# Patient Record
Sex: Male | Born: 1955 | Hispanic: Refuse to answer | Smoking: Current every day smoker
Health system: Southern US, Community
[De-identification: ages and names within clinical notes are randomized; demographics above are authoritative.]

## PROBLEM LIST (undated history)

## (undated) DIAGNOSIS — M5030 Other cervical disc degeneration, unspecified cervical region: Secondary | ICD-10-CM

## (undated) DIAGNOSIS — K828 Other specified diseases of gallbladder: Secondary | ICD-10-CM

## (undated) DIAGNOSIS — I10 Essential (primary) hypertension: Secondary | ICD-10-CM

## (undated) DIAGNOSIS — Z87442 Personal history of urinary calculi: Secondary | ICD-10-CM

## (undated) DIAGNOSIS — G5600 Carpal tunnel syndrome, unspecified upper limb: Secondary | ICD-10-CM

## (undated) DIAGNOSIS — E119 Type 2 diabetes mellitus without complications: Secondary | ICD-10-CM

## (undated) DIAGNOSIS — E7800 Pure hypercholesterolemia, unspecified: Secondary | ICD-10-CM

## (undated) HISTORY — PX: OTHER SURGICAL HISTORY: SHX169

## (undated) HISTORY — PX: APPENDECTOMY: SHX54

---

## 2008-01-20 ENCOUNTER — Emergency Department: Payer: Self-pay | Admitting: Emergency Medicine

## 2008-01-21 ENCOUNTER — Emergency Department (HOSPITAL_COMMUNITY): Admission: EM | Admit: 2008-01-21 | Discharge: 2008-01-21 | Payer: Self-pay | Admitting: Emergency Medicine

## 2008-10-13 ENCOUNTER — Ambulatory Visit: Payer: Self-pay | Admitting: Gastroenterology

## 2009-07-24 ENCOUNTER — Ambulatory Visit: Payer: Self-pay | Admitting: Internal Medicine

## 2009-09-22 ENCOUNTER — Encounter: Admission: RE | Admit: 2009-09-22 | Discharge: 2009-09-22 | Payer: Self-pay | Admitting: *Deleted

## 2009-10-06 ENCOUNTER — Encounter: Admission: RE | Admit: 2009-10-06 | Discharge: 2009-10-06 | Payer: Self-pay | Admitting: *Deleted

## 2009-12-17 ENCOUNTER — Encounter: Admission: RE | Admit: 2009-12-17 | Discharge: 2009-12-17 | Payer: Self-pay | Admitting: Neurosurgery

## 2010-03-31 ENCOUNTER — Emergency Department: Payer: Self-pay | Admitting: Emergency Medicine

## 2010-06-06 ENCOUNTER — Encounter: Payer: Self-pay | Admitting: Orthopedic Surgery

## 2011-02-16 LAB — POCT I-STAT, CHEM 8
BUN: 14
Calcium, Ion: 1.17
Chloride: 106
Creatinine, Ser: 0.8
Glucose, Bld: 101 — ABNORMAL HIGH
HCT: 42
Hemoglobin: 14.3
Potassium: 3.9
Sodium: 139
TCO2: 26

## 2011-02-16 LAB — CBC
HCT: 40.5
Hemoglobin: 13.8
MCHC: 34
MCV: 91
RBC: 4.45
RDW: 12.5

## 2011-02-16 LAB — POCT CARDIAC MARKERS
CKMB, poc: 1.6
Myoglobin, poc: 31.5
Troponin i, poc: <0.05

## 2011-05-08 IMAGING — CT CT CHEST W/ CM
1 series · 15 of 31 positions shown, 19 images · non-contrast
Comparison: none

REASON FOR EXAM: possible pulmonary nodule  abn chest xray
COMMENTS:

[Series 2: soft tissue · axial · 0.78mm/px · z∈[+359,+674]mm · 15 of 69 slices shown, 19 images]
[im 3/69  mediastinal]
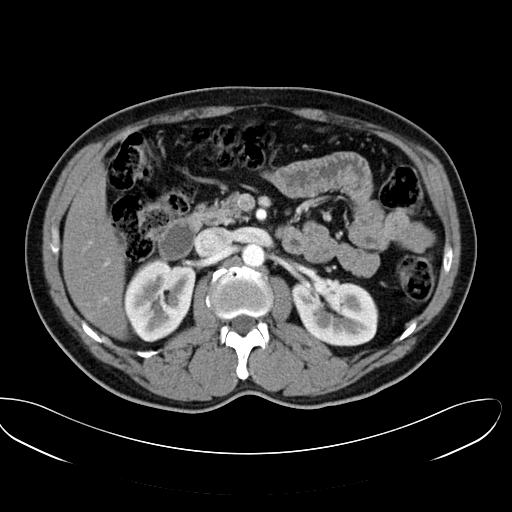
[im 3/69  lung]
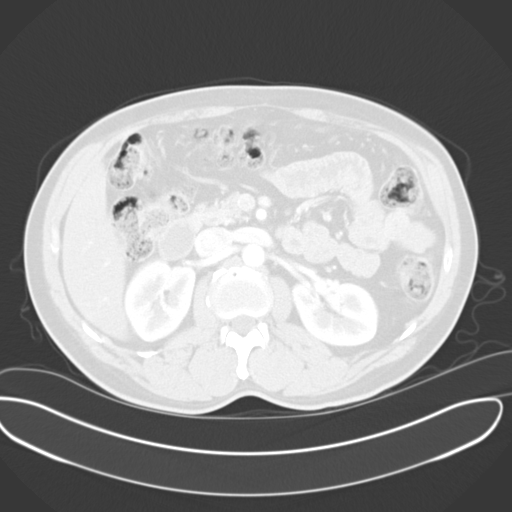
[im 8/69  lung]
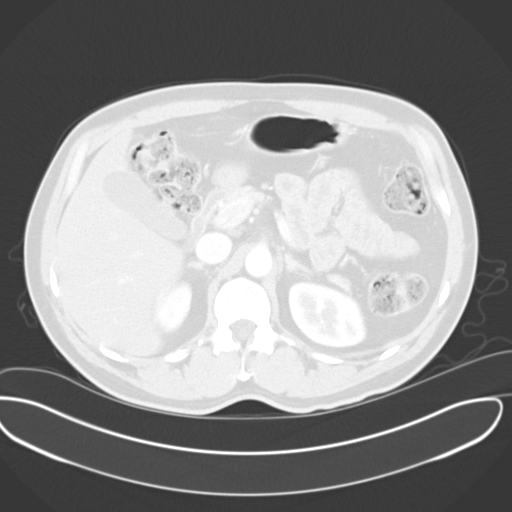
[im 13/69  lung]
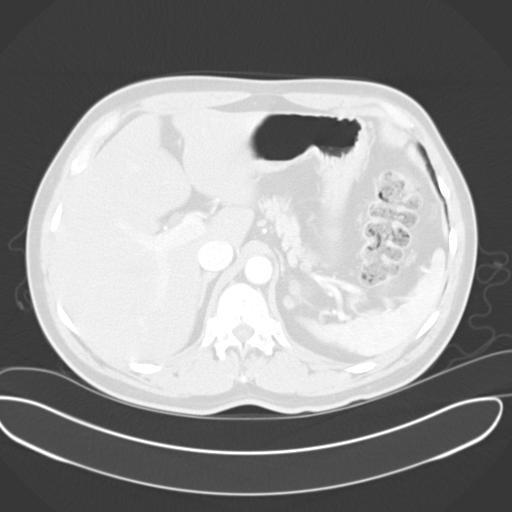
[im 16/69  lung]
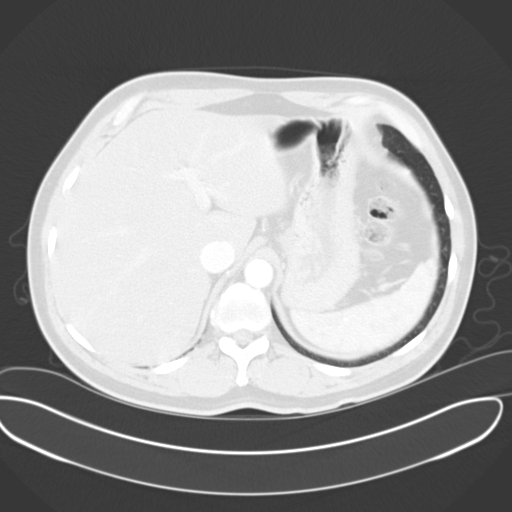
[im 21/69  mediastinal]
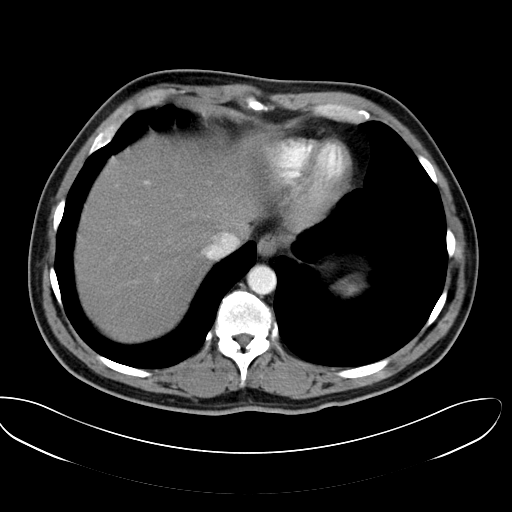
[im 21/69  lung]
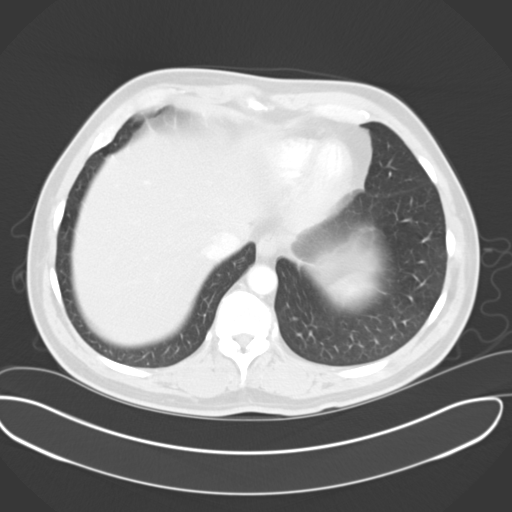
[im 26/69  lung]
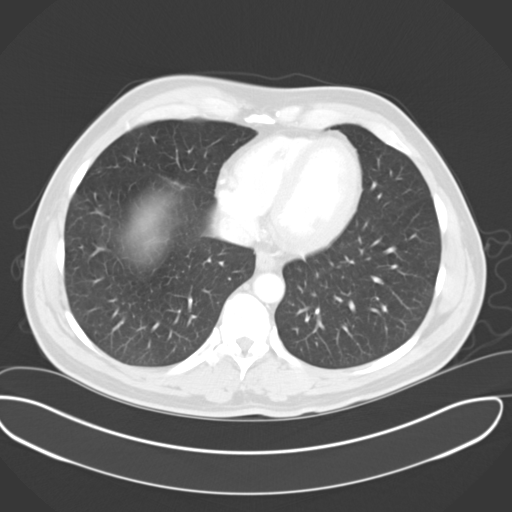
[im 31/69  lung]
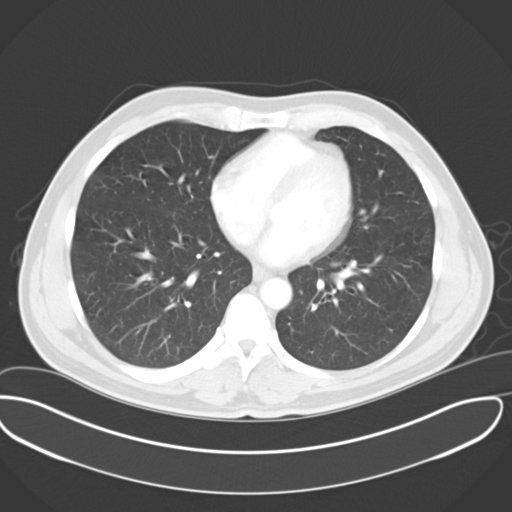
[im 36/69  lung]
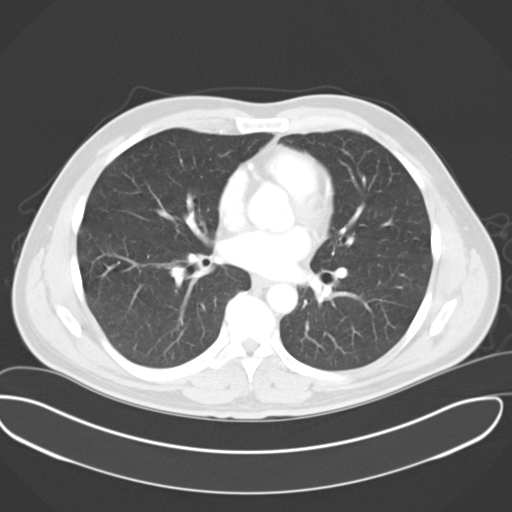
[im 38/69  mediastinal]
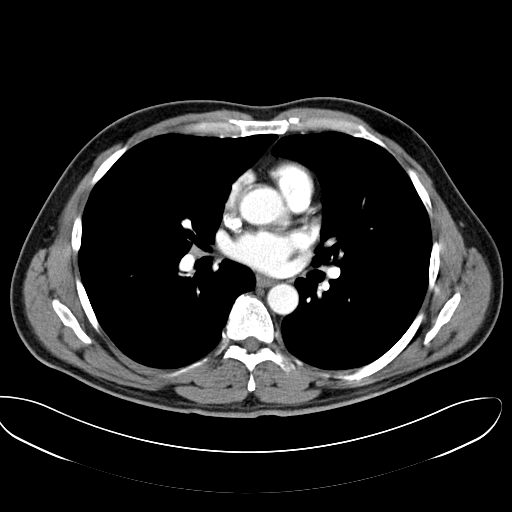
[im 38/69  lung]
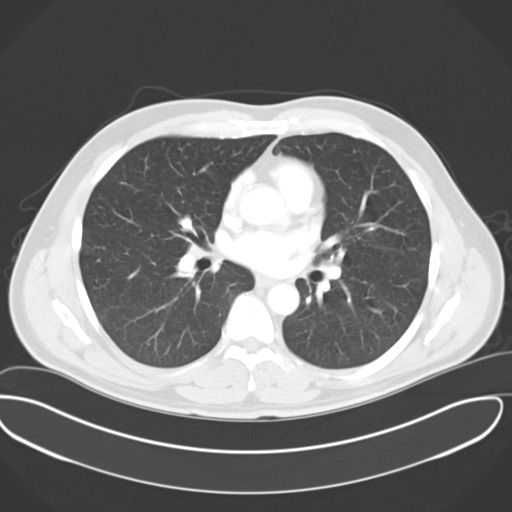
[im 43/69  lung]
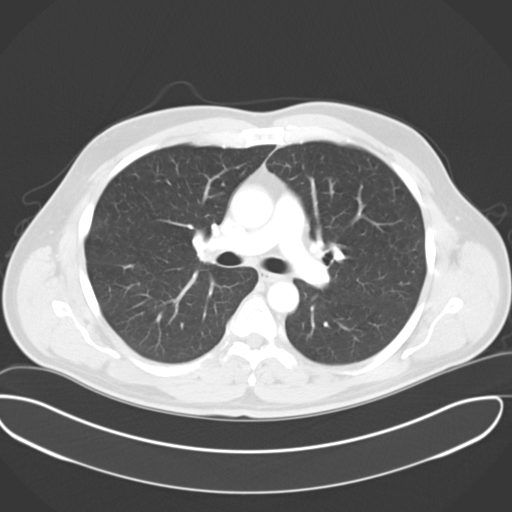
[im 48/69  lung]
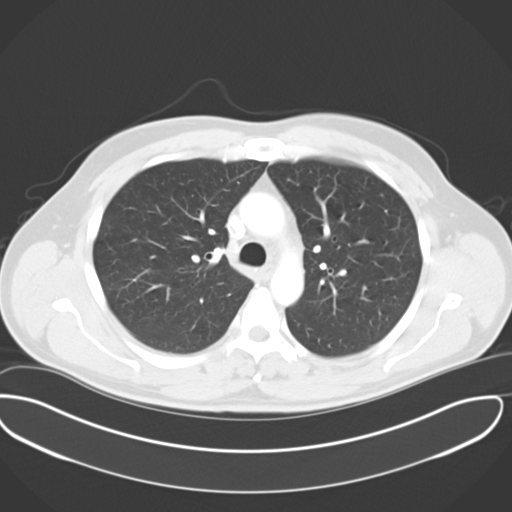
[im 53/69  lung]
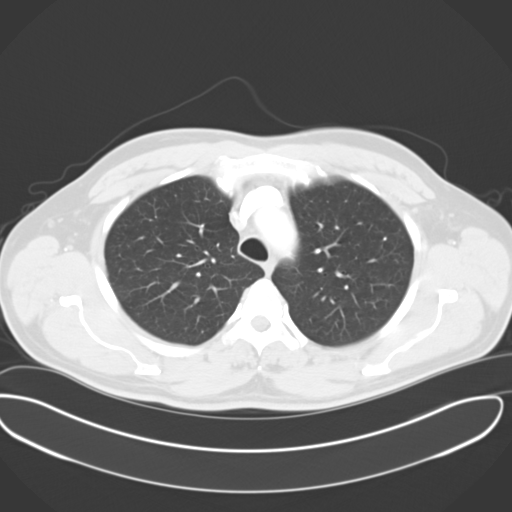
[im 56/69  mediastinal]
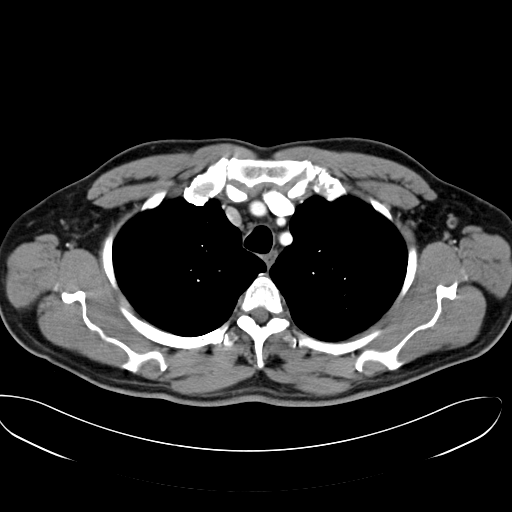
[im 56/69  lung]
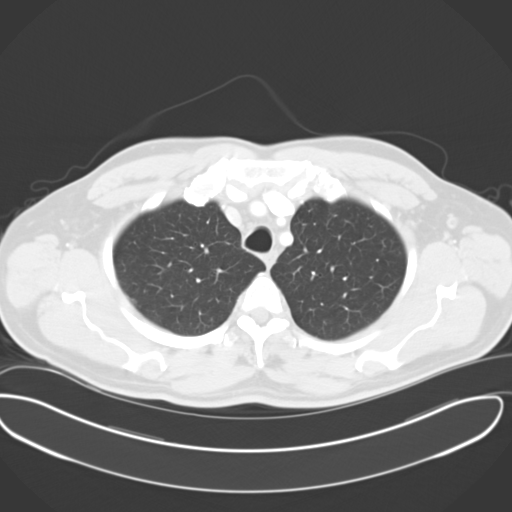
[im 61/69  lung]
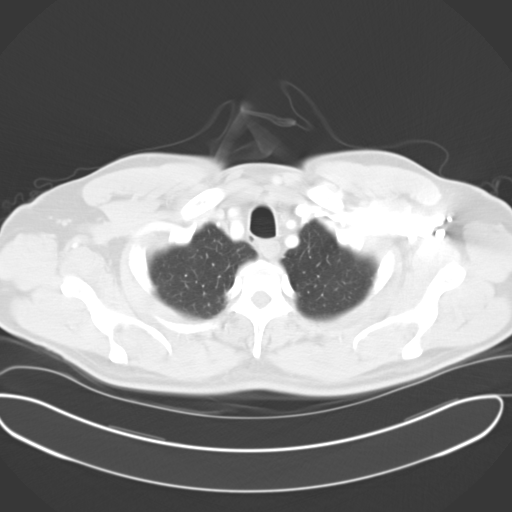
[im 66/69  lung]
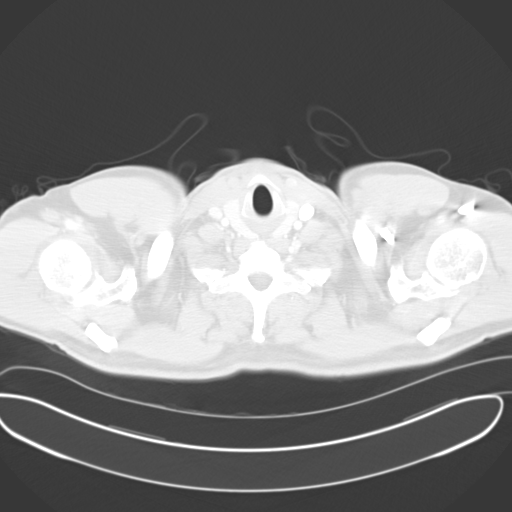

[15 of 31 positions shown; findings below may reference images not displayed]

PROCEDURE:     CT  - CT CHEST WITH CONTRAST  - July 24, 2009 [DATE]

RESULT:

Helical 5 mm sections were obtained from the thoracic inlet through the lung
bases status post intravenous administration of 75 ml Esovue-FSZ.

Evaluation of the mediastinum and hilar regions and structures demonstrates
small subcentimeter lymph nodes in the AP window. There is no evidence of
pathologic size adenopathy nor masses. There is no evidence of a thoracic
aortic aneurysm nor dissection. The lung parenchyma demonstrates no evidence
of focal infiltrates, effusions, masses nor nodules. A nonspecific,
ill-defined area of minimally increased density is appreciated along the
periphery of the lingula. This is in a subpleural location and differential
considerations are scar versus possibly a very minimal area of pleural
thickening. The visualized upper abdominal viscera demonstrate no gross
abnormalities.
IMPRESSION: 1. Nonspecific finding within the lingula likely representing very mild scar
or possibly minimal pleural thickening. No further focal or acute
abnormalities are identified.

## 2012-09-03 ENCOUNTER — Encounter (HOSPITAL_COMMUNITY): Payer: Self-pay | Admitting: *Deleted

## 2012-09-03 ENCOUNTER — Emergency Department (HOSPITAL_COMMUNITY)
Admission: EM | Admit: 2012-09-03 | Discharge: 2012-09-03 | Disposition: A | Discharge status: Home / Self Care | Payer: BC Managed Care – PPO | Attending: Emergency Medicine | Admitting: Emergency Medicine

## 2012-09-03 ENCOUNTER — Emergency Department (HOSPITAL_COMMUNITY): Payer: BC Managed Care – PPO

## 2012-09-03 DIAGNOSIS — E119 Type 2 diabetes mellitus without complications: Secondary | ICD-10-CM | POA: Insufficient documentation

## 2012-09-03 DIAGNOSIS — E7800 Pure hypercholesterolemia, unspecified: Secondary | ICD-10-CM | POA: Insufficient documentation

## 2012-09-03 DIAGNOSIS — Z79899 Other long term (current) drug therapy: Secondary | ICD-10-CM | POA: Insufficient documentation

## 2012-09-03 DIAGNOSIS — Y92410 Unspecified street and highway as the place of occurrence of the external cause: Secondary | ICD-10-CM | POA: Insufficient documentation

## 2012-09-03 DIAGNOSIS — F17200 Nicotine dependence, unspecified, uncomplicated: Secondary | ICD-10-CM | POA: Insufficient documentation

## 2012-09-03 DIAGNOSIS — Y939 Activity, unspecified: Secondary | ICD-10-CM | POA: Insufficient documentation

## 2012-09-03 DIAGNOSIS — S39012A Strain of muscle, fascia and tendon of lower back, initial encounter: Secondary | ICD-10-CM

## 2012-09-03 DIAGNOSIS — S335XXA Sprain of ligaments of lumbar spine, initial encounter: Secondary | ICD-10-CM | POA: Insufficient documentation

## 2012-09-03 HISTORY — DX: Pure hypercholesterolemia, unspecified: E78.00

## 2012-09-03 HISTORY — DX: Type 2 diabetes mellitus without complications: E11.9

## 2012-09-03 MED ORDER — NAPROXEN 500 MG PO TABS: 500.0000 mg | ORAL_TABLET | Freq: Two times a day (BID) | ORAL | Status: DC

## 2012-09-03 MED ORDER — DIAZEPAM 5 MG PO TABS: 5.0000 mg | ORAL_TABLET | Freq: Three times a day (TID) | ORAL | Status: DC | PRN

## 2012-09-03 MED ORDER — TRAMADOL HCL 50 MG PO TABS: 50.0000 mg | ORAL_TABLET | Freq: Four times a day (QID) | ORAL | Status: DC | PRN

## 2012-09-03 NOTE — ED Provider Notes (Signed)
Medical screening examination/treatment/procedure(s) were performed by non-physician practitioner and as supervising physician I was immediately available for consultation/collaboration.  Heide Brossart L Kierre Hintz, MD 09/03/12 1720 
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
## 2014-11-24 ENCOUNTER — Other Ambulatory Visit: Payer: Self-pay | Admitting: Family Medicine

## 2014-11-24 ENCOUNTER — Ambulatory Visit
Admission: RE | Admit: 2014-11-24 | Discharge: 2014-11-24 | Disposition: A | Discharge status: Home / Self Care | Payer: PRIVATE HEALTH INSURANCE | Source: Ambulatory Visit | Attending: Family Medicine | Admitting: Family Medicine

## 2014-11-24 DIAGNOSIS — R634 Abnormal weight loss: Secondary | ICD-10-CM

## 2014-11-24 DIAGNOSIS — D3502 Benign neoplasm of left adrenal gland: Secondary | ICD-10-CM | POA: Insufficient documentation

## 2014-11-24 DIAGNOSIS — R918 Other nonspecific abnormal finding of lung field: Secondary | ICD-10-CM

## 2014-11-24 DIAGNOSIS — R911 Solitary pulmonary nodule: Secondary | ICD-10-CM | POA: Insufficient documentation

## 2015-04-03 ENCOUNTER — Emergency Department: Payer: Worker's Compensation

## 2015-04-03 ENCOUNTER — Emergency Department
Admission: EM | Admit: 2015-04-03 | Discharge: 2015-04-04 | Disposition: A | Discharge status: Home / Self Care | Payer: Worker's Compensation | Attending: Emergency Medicine | Admitting: Emergency Medicine

## 2015-04-03 ENCOUNTER — Encounter: Payer: Self-pay | Admitting: Emergency Medicine

## 2015-04-03 DIAGNOSIS — S8991XA Unspecified injury of right lower leg, initial encounter: Secondary | ICD-10-CM | POA: Diagnosis not present

## 2015-04-03 DIAGNOSIS — Y990 Civilian activity done for income or pay: Secondary | ICD-10-CM | POA: Insufficient documentation

## 2015-04-03 DIAGNOSIS — Y9289 Other specified places as the place of occurrence of the external cause: Secondary | ICD-10-CM | POA: Diagnosis not present

## 2015-04-03 DIAGNOSIS — Z79899 Other long term (current) drug therapy: Secondary | ICD-10-CM | POA: Insufficient documentation

## 2015-04-03 DIAGNOSIS — X58XXXA Exposure to other specified factors, initial encounter: Secondary | ICD-10-CM | POA: Diagnosis not present

## 2015-04-03 DIAGNOSIS — M545 Low back pain: Secondary | ICD-10-CM

## 2015-04-03 DIAGNOSIS — F17200 Nicotine dependence, unspecified, uncomplicated: Secondary | ICD-10-CM | POA: Diagnosis not present

## 2015-04-03 DIAGNOSIS — E119 Type 2 diabetes mellitus without complications: Secondary | ICD-10-CM | POA: Diagnosis not present

## 2015-04-03 DIAGNOSIS — S8992XA Unspecified injury of left lower leg, initial encounter: Secondary | ICD-10-CM | POA: Insufficient documentation

## 2015-04-03 DIAGNOSIS — S3992XA Unspecified injury of lower back, initial encounter: Secondary | ICD-10-CM | POA: Insufficient documentation

## 2015-04-03 DIAGNOSIS — Y93F2 Activity, caregiving, lifting: Secondary | ICD-10-CM | POA: Insufficient documentation

## 2015-04-03 DIAGNOSIS — M79604 Pain in right leg: Secondary | ICD-10-CM

## 2015-04-03 LAB — COMPREHENSIVE METABOLIC PANEL
ALT: 17 U/L (ref 17–63)
AST: 20 U/L (ref 15–41)
Albumin: 4.6 g/dL (ref 3.5–5.0)
Alkaline Phosphatase: 61 U/L (ref 38–126)
Anion gap: 8 (ref 5–15)
BUN: 15 mg/dL (ref 6–20)
CHLORIDE: 102 mmol/L (ref 101–111)
CO2: 25 mmol/L (ref 22–32)
CREATININE: 0.64 mg/dL (ref 0.61–1.24)
Calcium: 9.2 mg/dL (ref 8.9–10.3)
GFR calc non Af Amer: >60 mL/min (ref >60)
Glucose, Bld: 365 mg/dL — ABNORMAL HIGH (ref 65–99)
Potassium: 4.2 mmol/L (ref 3.5–5.1)
SODIUM: 135 mmol/L (ref 135–145)
Total Bilirubin: 1.1 mg/dL (ref 0.3–1.2)
Total Protein: 7.5 g/dL (ref 6.5–8.1)

## 2015-04-03 LAB — CBC WITH DIFFERENTIAL/PLATELET
BASOS ABS: 0.1 10*3/uL (ref 0–0.1)
Basophils Relative: 1 %
EOS ABS: 0.3 10*3/uL (ref 0–0.7)
EOS PCT: 3 %
HCT: 45.5 % (ref 40.0–52.0)
Hemoglobin: 15.2 g/dL (ref 13.0–18.0)
Lymphocytes Relative: 41 %
Lymphs Abs: 3.6 10*3/uL (ref 1.0–3.6)
MCH: 29.5 pg (ref 26.0–34.0)
MCHC: 33.4 g/dL (ref 32.0–36.0)
MCV: 88.3 fL (ref 80.0–100.0)
Monocytes Absolute: 0.6 10*3/uL (ref 0.2–1.0)
Monocytes Relative: 6 %
Neutro Abs: 4.2 10*3/uL (ref 1.4–6.5)
Neutrophils Relative %: 49 %
PLATELETS: 283 10*3/uL (ref 150–440)
RBC: 5.15 MIL/uL (ref 4.40–5.90)
RDW: 12.8 % (ref 11.5–14.5)
WBC: 8.7 10*3/uL (ref 3.8–10.6)

## 2015-04-03 MED ORDER — ONDANSETRON HCL 4 MG/2ML IJ SOLN
4.0000 mg | Freq: Once | INTRAMUSCULAR | Status: AC
  Administered 2015-04-03: 4 mg via INTRAVENOUS
  Filled 2015-04-03: qty 2

## 2015-04-03 MED ORDER — CYCLOBENZAPRINE HCL 10 MG PO TABS: 10.0000 mg | ORAL_TABLET | Freq: Three times a day (TID) | ORAL | Status: DC | PRN

## 2015-04-03 MED ORDER — IBUPROFEN 600 MG PO TABS: 600.0000 mg | ORAL_TABLET | Freq: Once | ORAL | Status: DC

## 2015-04-03 MED ORDER — OXYCODONE-ACETAMINOPHEN 5-325 MG PO TABS: 1.0000 | ORAL_TABLET | ORAL | Status: DC | PRN

## 2015-04-03 MED ORDER — IBUPROFEN 800 MG PO TABS: 800.0000 mg | ORAL_TABLET | Freq: Three times a day (TID) | ORAL | Status: DC | PRN

## 2015-04-03 MED ORDER — HYDROMORPHONE HCL 1 MG/ML IJ SOLN
0.5000 mg | Freq: Once | INTRAMUSCULAR | Status: AC
  Administered 2015-04-03: 0.5 mg via INTRAVENOUS
  Filled 2015-04-03: qty 1

## 2015-04-03 MED ORDER — HYDROMORPHONE HCL 1 MG/ML IJ SOLN
1.0000 mg | Freq: Once | INTRAMUSCULAR | Status: AC
  Administered 2015-04-03: 1 mg via INTRAVENOUS
  Filled 2015-04-03: qty 1

## 2015-04-03 MED ORDER — CYCLOBENZAPRINE HCL 10 MG PO TABS
10.0000 mg | ORAL_TABLET | Freq: Once | ORAL | Status: AC
  Administered 2015-04-03: 10 mg via ORAL
  Filled 2015-04-03: qty 1

## 2015-04-03 NOTE — ED Notes (Signed)
Pt to MRI at this time with ED tech

## 2015-04-03 NOTE — ED Notes (Signed)
MD Lord at bedside. 
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
## 2015-04-03 NOTE — ED Notes (Signed)
RN spoke to MRI tech who verbalized will be arriving shortly, pt made aware and verbalized understanding at this time.

## 2015-04-03 NOTE — ED Notes (Signed)
Called lab at this time for add on of blood work
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
## 2015-04-03 NOTE — Discharge Instructions (Signed)
You're being treated for low back strain and bulging disc/pinched nerve. Return to the emergency department for any worsening condition including worsening pain, new weakness, numbness, incontinence of urine or bowel, fever, or any other symptoms concerning to you.  You should follow-up with a neurologist, next available for symptoms of numbness. I also recommend you follow-up with primary care doctor early next week, and you may need referral to a back surgeon.   Back Pain, Adult Back pain is very common. The pain often gets better over time. The cause of back pain is usually not dangerous. Most people can learn to manage their back pain on their own.  HOME CARE  Watch your back pain for any changes. The following actions may help to lessen any pain you are feeling:  Stay active. Start with short walks on flat ground if you can. Try to walk farther each day.  Exercise regularly as told by your doctor. Exercise helps your back heal faster. It also helps avoid future injury by keeping your muscles strong and flexible.  Do not sit, drive, or stand in one place for more than 30 minutes.  Do not stay in bed. Resting more than 1-2 days can slow down your recovery.  Be careful when you bend or lift an object. Use good form when lifting:  Bend at your knees.  Keep the object close to your body.  Do not twist.  Sleep on a firm mattress. Lie on your side, and bend your knees. If you lie on your back, put a pillow under your knees.  Take medicines only as told by your doctor.  Put ice on the injured area.  Put ice in a plastic bag.  Place a towel between your skin and the bag.  Leave the ice on for 20 minutes, 2-3 times a day for the first 2-3 days. After that, you can switch between ice and heat packs.  Avoid feeling anxious or stressed. Find good ways to deal with stress, such as exercise.  Maintain a healthy weight. Extra weight puts stress on your back. GET HELP IF:   You have  pain that does not go away with rest or medicine.  You have worsening pain that goes down into your legs or buttocks.  You have pain that does not get better in one week.  You have pain at night.  You lose weight.  You have a fever or chills. GET HELP RIGHT AWAY IF:   You cannot control when you poop (bowel movement) or pee (urinate).  Your arms or legs feel weak.  Your arms or legs lose feeling (numbness).  You feel sick to your stomach (nauseous) or throw up (vomit).  You have belly (abdominal) pain.  You feel like you may pass out (faint).   This information is not intended to replace advice given to you by your health care provider. Make sure you discuss any questions you have with your health care provider.   Document Released: 10/19/2007 Document Revised: 05/23/2014 Document Reviewed: 09/03/2013 Elsevier Interactive Patient Education [employer].

## 2015-04-03 NOTE — ED Notes (Signed)
Pt returned from MRI at this time

## 2015-04-03 NOTE — ED Notes (Signed)
Pt will be d.c once papers are placed.

## 2015-04-04 NOTE — ED Notes (Signed)
Pt will be d/c after 20 min med hold due to IV pain meds, pt and family made aware and verbalized understanding at this time.

## 2015-04-08 ENCOUNTER — Telehealth: Payer: Self-pay | Admitting: Emergency Medicine

## 2015-04-08 NOTE — ED Notes (Signed)
Pt called and says he did not get the work note when he was here.  He says he was supposed to get 2 days off.  I told him the note is in the chart and that he can get a copy in medical records if he needs it.  He says he was supposed to get Monday and Tuesday off--I explained that he was to go back 11/21, which would give him 2 days off following his ED visit.  He said he did not have to work on Sat and Sun, but I explained that it does not matter what his schedule is, the days off are consecutive after the ED visit.

## 2015-07-18 ENCOUNTER — Ambulatory Visit
Admission: EM | Admit: 2015-07-18 | Discharge: 2015-07-18 | Disposition: A | Discharge status: Home / Self Care | Payer: BLUE CROSS/BLUE SHIELD | Attending: Family Medicine | Admitting: Family Medicine

## 2015-07-18 ENCOUNTER — Encounter: Payer: Self-pay | Admitting: Gynecology

## 2015-07-18 DIAGNOSIS — E11622 Type 2 diabetes mellitus with other skin ulcer: Secondary | ICD-10-CM | POA: Diagnosis not present

## 2015-07-18 DIAGNOSIS — L98499 Non-pressure chronic ulcer of skin of other sites with unspecified severity: Secondary | ICD-10-CM

## 2015-07-18 LAB — BASIC METABOLIC PANEL
Anion gap: 9 (ref 5–15)
BUN: 17 mg/dL (ref 6–20)
CO2: 23 mmol/L (ref 22–32)
Calcium: 9.2 mg/dL (ref 8.9–10.3)
Chloride: 101 mmol/L (ref 101–111)
Creatinine, Ser: 0.79 mg/dL (ref 0.61–1.24)
GFR calc Af Amer: >60 mL/min (ref >60)
GFR calc non Af Amer: >60 mL/min (ref >60)
Glucose, Bld: 570 mg/dL (ref 65–99)
Potassium: 4.3 mmol/L (ref 3.5–5.1)
Sodium: 133 mmol/L — ABNORMAL LOW (ref 135–145)

## 2015-07-18 LAB — GLUCOSE, CAPILLARY: Glucose-Capillary: 509 mg/dL — ABNORMAL HIGH (ref 65–99)

## 2015-07-18 MED ORDER — MUPIROCIN 2 % EX OINT: 1.0000 "application " | TOPICAL_OINTMENT | Freq: Three times a day (TID) | CUTANEOUS | Status: DC

## 2015-07-18 MED ORDER — ERYTHROMYCIN 5 MG/GM OP OINT: TOPICAL_OINTMENT | OPHTHALMIC | Status: DC

## 2015-07-18 MED ORDER — METFORMIN HCL 500 MG PO TABS
500.0000 mg | ORAL_TABLET | Freq: Two times a day (BID) | ORAL | Status: DC
Start: 2015-07-18 — End: 2020-01-21

## 2015-07-18 MED ORDER — SULFAMETHOXAZOLE-TRIMETHOPRIM 800-160 MG PO TABS: 1.0000 | ORAL_TABLET | Freq: Two times a day (BID) | ORAL | Status: DC

## 2015-07-18 NOTE — ED Notes (Signed)
Patient c/o bilateral eye drainage /redness/ and itching. Patient also sore in right nose. Pt. Stated would like refill on his metformin. Patient stated has not seen his primary  for couple months and has been out of his medication.

## 2015-07-18 NOTE — Discharge Instructions (Signed)
How to Avoid Diabetes Problems  You can do a lot to prevent or slow down diabetes problems. Following your diabetes plan and taking care of yourself can reduce your risk of serious or life-threatening complications. Below, you will find certain things you can do to prevent diabetes problems.  MANAGE YOUR DIABETES  Follow your health care provider's, nurse educator's, and dietitian's instructions for managing your diabetes. They will teach you the basics of diabetes care. They can help answer questions you may have. Learn about diabetes and make healthy choices regarding eating and physical activity. Monitor your blood glucose level regularly. Your health care provider will help you decide how often to check your blood glucose level depending on your treatment goals and how well you are meeting them.   DO NOT USE NICOTINE  Nicotine and diabetes are a dangerous combination. Nicotine raises your risk for diabetes problems. If you quit using nicotine, you will lower your risk for heart attack, stroke, nerve disease, and kidney disease. Your cholesterol and your blood pressure levels may improve. Your blood circulation will also improve. Do not use any tobacco products, including cigarettes, chewing tobacco, or electronic cigarettes. If you need help quitting, ask your health care provider.  KEEP YOUR BLOOD PRESSURE UNDER CONTROL  Your health care provider will determine your individualized target blood pressure based on your age, your medicines, how long you have had diabetes, and any other medical conditions you have. Blood pressure consists of two numbers. Generally, the goal is to keep your top number (systolic pressure) at or below 130, and your bottom number (diastolic pressure) at or below 80. Your health care provider may recommend a lower target blood pressure reading, if appropriate. Meal planning, medicines, and exercise can help you reach your target blood pressure. Make sure your health care provider checks  your blood pressure at every visit.  KEEP YOUR CHOLESTEROL UNDER CONTROL  Normal cholesterol levels will help prevent heart disease and stroke. These are the biggest health problems for people with diabetes. Keeping cholesterol levels under control can also help with blood flow. Have your cholesterol level checked at least once a year. Your health care provider may prescribe a medicine known as a statin. Statins lower your cholesterol. If you are not taking a statin, ask your health care provider if you should be. Meal planning, exercise, and medicines can help you reach your cholesterol targets.   SCHEDULE AND KEEP YOUR ANNUAL PHYSICAL EXAMS AND EYE EXAMS  Your health care provider will tell you how often he or she wants to see you depending on your plan of treatment. It is important that you keep these appointments so that possible problems can be identified early and complications can be avoided or treated.  · Every visit with your health care provider should include your weight, blood pressure, and an evaluation of your blood glucose control.  · Your hemoglobin A1c should be checked:    At least twice a year if you are at your goal.    Every 3 months if there are changes in treatment.    If you are not meeting your goals.  · Your blood lipids should be checked yearly. You should also be checked yearly to see if you have protein in your urine (microalbumin).  · Schedule a dilated eye exam within 5 years of your diagnosis if you have type 1 diabetes, and then yearly. Schedule a dilated eye exam at diagnosis if you have type 2 diabetes, and then yearly. All   exams thereafter can be extended to every 2 to 3 years if one or more exams have been normal.  KEEP YOUR VACCINES CURRENT  It is recommended that you receive a flu (influenza) vaccine every year. It is also recommended that you receive a pneumonia (pneumococcal) vaccine. If you are 65 years of age or older and have never received a pneumonia vaccine, this  vaccine may be given as a series of two separate shots. Ask your health care provider which additional vaccines may be recommended.  TAKE CARE OF YOUR FEET   Diabetes may cause you to have a poor blood supply (circulation) to your legs and feet. Because of this, the skin may be thinner, break easier, and heal more slowly. You also may have nerve damage in your legs and feet, causing decreased feeling. You may not notice minor injuries to your feet that could lead to serious problems or infections. Taking care of your feet is very important.  Visual foot exams are performed at every routine medical visit. The exams check for cuts, injuries, or other problems with the feet. A comprehensive foot exam should be done yearly. This includes visual inspection as well as assessing foot pulses and testing for loss of sensation. You should also do the following:  · Inspect your feet daily for cuts, calluses, blisters, ingrown toenails, and signs of infection, such as redness, swelling, or pus.  · Wash and dry your feet thoroughly, especially between the toes.  · Avoid soaking your feet regularly in hot water baths.  · Moisturize dry skin with lotion, avoiding areas between your toes.  · Cut toenails straight across and file the edges.  · Avoid shoes that do not fit well or have areas that irritate your skin.  · Avoid going barefooted or wearing only socks. Your feet need protection.  TAKE CARE OF YOUR TEETH  People with poorly controlled diabetes are more likely to have gum (periodontal) disease. These infections make diabetes harder to control. Periodontal diseases, if left untreated, can lead to tooth loss. Brush your teeth twice a day, floss, and see your dentist for checkups and cleaning every 6 months, or 2 times a year.  ASK YOUR HEALTH CARE PROVIDER ABOUT TAKING ASPIRIN  Taking aspirin daily is recommended to help prevent cardiovascular disease in people with and without diabetes. Ask your health care provider if this  would benefit you and what dose he or she would recommend.  DRINK RESPONSIBLY  Moderate amounts of alcohol (less than 1 drink per day for adult women and less than 2 drinks per day for adult men) have a minimal effect on blood glucose if ingested with food. It is important to eat food with alcohol to avoid hypoglycemia. People should avoid alcohol if they have a history of alcohol abuse or dependence, if they are pregnant, and if they have liver disease, pancreatitis, advanced neuropathy, or severe hypertriglyceridemia.  LESSEN STRESS  Living with diabetes can be stressful. When you are under stress, your blood glucose may be affected in two ways:  · Stress hormones may cause your blood glucose to rise.  · You may be distracted from taking good care of yourself.  It is a good idea to be aware of your stress level and make changes that are necessary to help you better manage challenging situations. Support groups, planned relaxation, a hobby you enjoy, meditation, healthy relationships, and exercise all work to lower your stress level. If your efforts do not seem to be helping,   get help from your health care provider or a trained mental health professional.     This information is not intended to replace advice given to you by your health care provider. Make sure you discuss any questions you have with your health care provider.     Document Released: 01/18/2011 Document Revised: 05/23/2014 Document Reviewed: 06/26/2013  Elsevier Interactive Patient Education ©2016 [employer]

## 2015-07-18 NOTE — ED Provider Notes (Signed)
CSN: [id number]     Arrival date & time 07/18/15  1050 History   First MD Initiated Contact with Patient 07/18/15 1229     Chief Complaint  Patient presents with  . Eye Drainage   (Consider location/radiation/quality/duration/timing/severity/associated sxs/prior Treatment) HPI   60 year old male who presents several separate issues.  First problem is a sore on the bottom eyelashes first started the left side and has now gone to the right with some residual pain in the left. No visual disturbance no pain no photosensitivity no discharge and no foreign body sensation. He does not wear contacts but use to. He said this same problem before it seemed to resolve on its own.   Second problem is that of diabetes mellitus which has not been treated for several months because he did not have insurance and had no money to buy his metformin. He has had metformin usage for some time in the past 1000 mg twice a day but, was most recently ,several months ago decreased to 500 mg twice a day because his blood sugars were going too low, placed on the metformin by Dr. Caryl Comes but, because of nonpayment he was released from Dr. Olin Pia service. He now has regained insurance and is planning on reestablishing with Dr. Caryl Comes in the very near future  His last problem is that of an infection on the tip of his nose that is slightly inside on the right side. It is very tender to the touch . He has had this in the past and has recurred. When he had in the past he was treated with an injection of penicillin followed some antibiotic orally and  resolved only to return recently. Is been no drainage but is very tender to the touch.  Past Medical History  Diagnosis Date  . Hypercholesterolemia   . Diabetes mellitus without complication [facility])    Past Surgical History  Procedure Laterality Date  . Appendectomy     Family History  Problem Relation Age of Onset  . Kidney disease Mother   . Kidney disease Father   . Diabetes  Father   . Hypertension Father    Social History  Substance Use Topics  . Smoking status: Current Every Day Smoker  . Smokeless tobacco: None  . Alcohol Use: No    Review of Systems  Constitutional: Negative for fever, chills, activity change, appetite change and fatigue.  Eyes: Positive for pain and itching.  All other systems reviewed and are negative.   Allergies  Review of patient's allergies indicates no known allergies.  Home Medications   Prior to Admission medications   Medication Sig Start Date End Date Taking? Authorizing Provider  cyclobenzaprine (FLEXERIL) 10 MG tablet Take 1 tablet (10 mg total) by mouth every 8 (eight) hours as needed for muscle spasms. 04/03/15   Lisa Roca, MD  diazepam (VALIUM) 5 MG tablet Take 1 tablet (5 mg total) by mouth every 8 (eight) hours as needed. 09/03/12   Tatyana Kirichenko, PA-C  erythromycin ophthalmic ointment Place a 1/2 inch ribbon of ointment into the lower eyelid QID x 1-2 weeks. 07/18/15   Lorin Picket, PA-C  lovastatin (MEVACOR) 40 MG tablet Take 40 mg by mouth at bedtime.    Historical Provider, MD  metFORMIN (GLUCOPHAGE) 500 MG tablet Take 1 tablet (500 mg total) by mouth 2 (two) times daily with a meal. 07/18/15   Lorin Picket, PA-C  mupirocin ointment (BACTROBAN) 2 % Apply 1 application topically 3 (three) times daily. 07/18/15  Lorin Picket, PA-C  naproxen (NAPROSYN) 500 MG tablet Take 1 tablet (500 mg total) by mouth 2 (two) times daily with a meal. 09/03/12   Tatyana Kirichenko, PA-C  oxyCODONE-acetaminophen (ROXICET) 5-325 MG tablet Take 1 tablet by mouth every 4 (four) hours as needed for severe pain. 04/03/15   Lisa Roca, MD  sulfamethoxazole-trimethoprim (BACTRIM DS,SEPTRA DS) 800-160 MG tablet Take 1 tablet by mouth 2 (two) times daily. 07/18/15   Lorin Picket, PA-C  traMADol (ULTRAM) 50 MG tablet Take 1 tablet (50 mg total) by mouth every 6 (six) hours as needed for pain. 09/03/12   Jeannett Senior,  PA-C   Meds Ordered and Administered this Visit  Medications - No data to display  BP 105/77 mmHg  Pulse 65  Temp(Src) 98 F (36.7 C) (Oral)  Resp 16  Ht 5\' 7"  (1.702 m)  Wt 140 lb (63.504 kg)  BMI 21.92 kg/m2  SpO2 100% No data found.   Physical Exam  Constitutional: He is oriented to person, place, and time. He appears well-developed and well-nourished. No distress.  HENT:  Head: Normocephalic and atraumatic.  Right Ear: External ear normal.  Left Ear: External ear normal.  His admission of the eyes shows PERRLA EOMs are intact. Conjunctiva is clear  clear. On the right side in the lower lashes is a stye. Is erythematous and mildly swollen. No drainage is seen. There is a small residual stye on the right towards the medial canthus and on the lower lid. Examination of the nose shows erythema of the tip of the nose which is blanchable. This is tender up acutely above the right nostril. Inspection inside shows a small pimple-like structure over the anterior medial aspect near the junction of the septum. There is no adenopathy appreciated. He has no facial swelling or tenderness of the nasal bones or in the malar region.  Eyes: Conjunctivae and EOM are normal. Pupils are equal, round, and reactive to light. Right eye exhibits no discharge. Left eye exhibits no discharge.  Refer to HEENT  Neck: Normal range of motion. Neck supple.  Musculoskeletal: Normal range of motion. He exhibits no edema or tenderness.  Lymphadenopathy:    He has no cervical adenopathy.  Neurological: He is alert and oriented to person, place, and time.  Skin: Skin is warm and dry. He is not diaphoretic. There is erythema.  Psychiatric: He has a normal mood and affect. His behavior is normal. Judgment and thought content normal.  Nursing note and vitals reviewed.   ED Course  Procedures (including critical care time)  Labs Review Labs Reviewed  GLUCOSE, CAPILLARY - Abnormal; Notable for the following:     Glucose-Capillary 509 (*)    All other components within normal limits  BASIC METABOLIC PANEL - Abnormal; Notable for the following:    Sodium 133 (*)    Glucose, Bld 570 (*)    All other components within normal limits    Imaging Review No results found.   Visual Acuity Review  Right Eye Distance:   Left Eye Distance:   Bilateral Distance:    Right Eye Near: R Near: 20/50 (patient wear contact lens but does not have anymore) Left Eye Near:  L Near: 20/40 (pt. wear contact lens but does not have anymore.) Bilateral Near:  20/40 (without corrective lens. Pt. stated wear contacts lens but d)      MDM   1. Type 2 diabetes mellitus with other skin ulcer [facility])    Discharge Medication List  as of 07/18/2015  1:32 PM    START taking these medications   Details  erythromycin ophthalmic ointment Place a 1/2 inch ribbon of ointment into the lower eyelid QID x 1-2 weeks., Normal    mupirocin ointment (BACTROBAN) 2 % Apply 1 application topically 3 (three) times daily., Starting 07/18/2015, Until Discontinued, Normal    sulfamethoxazole-trimethoprim (BACTRIM DS,SEPTRA DS) 800-160 MG tablet Take 1 tablet by mouth 2 (two) times daily., Starting 07/18/2015, Until Discontinued, Normal      Plan: 1. Test/x-ray results and diagnosis reviewed with patient 2. rx as per orders; risks, benefits, potential side effects reviewed with patient 3. Recommend supportive treatment with warm compresses to the high times a day for 10-15 minutes each time. Wellness most important ointment and we will supplement with some antibiotic ointment. Encouraged highly to reestablish with Dr. Caryl Comes so he could have other implant worked up and into treat his glucose appropriately and be followed on an ongoing basis. Because of diabetes in the infection on the tip of his nose I will place him on an oral antibiotic and intranasal bike ointment. If he begins to notice any increase in fever aching chills or drainage or any  change in the nose he should go immediately to the emergency room. I told him that he should notice improvement within 1-2 days. 4. F/u primary care [place], PA-C 07/18/15 1346

## 2016-01-11 ENCOUNTER — Emergency Department: Payer: BLUE CROSS/BLUE SHIELD

## 2016-01-11 ENCOUNTER — Emergency Department
Admission: EM | Admit: 2016-01-11 | Discharge: 2016-01-11 | Disposition: A | Discharge status: Home / Self Care | Payer: BLUE CROSS/BLUE SHIELD | Attending: Student in an Organized Health Care Education/Training Program | Admitting: Student in an Organized Health Care Education/Training Program

## 2016-01-11 ENCOUNTER — Encounter: Payer: Self-pay | Admitting: Emergency Medicine

## 2016-01-11 DIAGNOSIS — M549 Dorsalgia, unspecified: Secondary | ICD-10-CM | POA: Diagnosis present

## 2016-01-11 DIAGNOSIS — Z7984 Long term (current) use of oral hypoglycemic drugs: Secondary | ICD-10-CM | POA: Diagnosis not present

## 2016-01-11 DIAGNOSIS — M62830 Muscle spasm of back: Secondary | ICD-10-CM | POA: Insufficient documentation

## 2016-01-11 DIAGNOSIS — F17200 Nicotine dependence, unspecified, uncomplicated: Secondary | ICD-10-CM | POA: Insufficient documentation

## 2016-01-11 DIAGNOSIS — E119 Type 2 diabetes mellitus without complications: Secondary | ICD-10-CM | POA: Insufficient documentation

## 2016-01-11 DIAGNOSIS — M546 Pain in thoracic spine: Secondary | ICD-10-CM | POA: Insufficient documentation

## 2016-01-11 MED ORDER — HYDROCODONE-ACETAMINOPHEN 5-325 MG PO TABS: 1.0000 | ORAL_TABLET | Freq: Four times a day (QID) | ORAL | 0 refills | Status: DC | PRN

## 2016-01-11 MED ORDER — CYCLOBENZAPRINE HCL 5 MG PO TABS: 5.0000 mg | ORAL_TABLET | Freq: Three times a day (TID) | ORAL | 0 refills | Status: DC | PRN

## 2016-01-11 MED ORDER — KETOROLAC TROMETHAMINE 30 MG/ML IJ SOLN
30.0000 mg | Freq: Once | INTRAMUSCULAR | Status: AC
  Administered 2016-01-11: 30 mg via INTRAMUSCULAR
  Filled 2016-01-11: qty 1

## 2016-01-11 MED ORDER — ORPHENADRINE CITRATE 30 MG/ML IJ SOLN
60.0000 mg | Freq: Once | INTRAMUSCULAR | Status: AC
  Administered 2016-01-11: 60 mg via INTRAMUSCULAR
  Filled 2016-01-11: qty 2

## 2016-01-11 NOTE — ED Triage Notes (Signed)
Pain to left upper back and upper chest when taking deep breaths.  Patient states shallow breaths do not hurt.

## 2016-01-11 NOTE — ED Provider Notes (Signed)
[facility] Provider Note   CSN: [id number] Arrival date & time: 01/11/16  [id number]     History   Chief Complaint Chief Complaint  Patient presents with  . Back Pain    HPI Jeremiah Mueller is a 60 y.o. male presents to the emergency department for evaluation of left-sided back pain. Patient points to the superior medial scapular border of the left thoracic spine. Patient states one day ago he woke up with sharp pain that is increased with taking a deep breath. Patient has pain with rest it as mild but pain can reach severe with taking a deep breath. He describes pain as sharp without radiation. There is no anterior chest pain. He denies any shortness of breath. He has not had any medications for the pain. He denies any neck pain numbness tingling or radicular symptoms. No trauma or injury.  HPI  Past Medical History:  Diagnosis Date  . Diabetes mellitus without complication ([place])   . Hypercholesterolemia     There are no active problems to display for this patient.   Past Surgical History:  Procedure Laterality Date  . APPENDECTOMY         Home Medications    Prior to Admission medications   Medication Sig Start Date End Date Taking? Authorizing Provider  cyclobenzaprine (FLEXERIL) 5 MG tablet Take 1-2 tablets (5-10 mg total) by mouth 3 (three) times daily as needed for muscle spasms. 01/11/16   Duanne Guess, PA-C  diazepam (VALIUM) 5 MG tablet Take 1 tablet (5 mg total) by mouth every 8 (eight) hours as needed. 09/03/12   Tatyana Kirichenko, PA-C  erythromycin ophthalmic ointment Place a 1/2 inch ribbon of ointment into the lower eyelid QID x 1-2 weeks. 07/18/15   Lorin Picket, PA-C  HYDROcodone-acetaminophen (NORCO) 5-325 MG tablet Take 1 tablet by mouth every 6 (six) hours as needed for moderate pain. 01/11/16   Duanne Guess, PA-C  lovastatin (MEVACOR) 40 MG tablet Take 40 mg by mouth at bedtime.    Historical Provider, MD  metFORMIN (GLUCOPHAGE)  500 MG tablet Take 1 tablet (500 mg total) by mouth 2 (two) times daily with a meal. 07/18/15   Lorin Picket, PA-C  mupirocin ointment (BACTROBAN) 2 % Apply 1 application topically 3 (three) times daily. 07/18/15   Lorin Picket, PA-C  naproxen (NAPROSYN) 500 MG tablet Take 1 tablet (500 mg total) by mouth 2 (two) times daily with a meal. 09/03/12   Tatyana Kirichenko, PA-C  sulfamethoxazole-trimethoprim (BACTRIM DS,SEPTRA DS) 800-160 MG tablet Take 1 tablet by mouth 2 (two) times daily. 07/18/15   Lorin Picket, PA-C    Family History Family History  Problem Relation Age of Onset  . Kidney disease Mother   . Kidney disease Father   . Diabetes Father   . Hypertension Father     Social History Social History  Substance Use Topics  . Smoking status: Current Every Day Smoker  . Smokeless tobacco: Never Used  . Alcohol use No     Allergies   Review of patient's allergies indicates no known allergies.   Review of Systems Review of Systems  Constitutional: Negative.  Negative for activity change, appetite change, chills and fever.  HENT: Negative for congestion, ear pain, mouth sores, rhinorrhea, sinus pressure, sore throat and trouble swallowing.   Eyes: Negative for photophobia, pain and discharge.  Respiratory: Negative for cough, chest tightness and shortness of breath.   Cardiovascular: Negative for chest pain and leg swelling.  Gastrointestinal: Negative for abdominal distention, abdominal pain, diarrhea, nausea and vomiting.  Genitourinary: Negative for difficulty urinating and dysuria.  Musculoskeletal: Positive for back pain. Negative for arthralgias, gait problem, neck pain and neck stiffness.  Skin: Negative for color change and rash.  Neurological: Negative for dizziness, weakness, numbness and headaches.  Hematological: Negative for adenopathy.  Psychiatric/Behavioral: Negative for agitation and behavioral problems.     Physical Exam Updated Vital Signs BP  117/76   Pulse (!) 58   Temp 98.7 F (37.1 C) (Oral)   Resp 20   Ht 5\' 7"  (1.702 m)   Wt 63.5 kg   SpO2 99%   BMI 21.93 kg/m   Physical Exam  Constitutional: He is oriented to person, place, and time. He appears well-developed and well-nourished.  HENT:  Head: Normocephalic and atraumatic.  Right Ear: External ear normal.  Left Ear: External ear normal.  Nose: Nose normal.  Eyes: Conjunctivae and EOM are normal. Pupils are equal, round, and reactive to light.  Neck: Normal range of motion. Neck supple.  Cardiovascular: Normal rate, regular rhythm, normal heart sounds and intact distal pulses.   Pulmonary/Chest: Effort normal and breath sounds normal. No respiratory distress. He has no wheezes. He has no rales. He exhibits no tenderness.  Abdominal: Soft. Bowel sounds are normal. He exhibits no distension. There is no tenderness.  Musculoskeletal:  Examination of the cervical spine shows patient has no spinous process tenderness. He has full active range of motion of cervical spine with no discomfort. Patient is tender along the superior medial border of the left scapula. There is tenderness to palpation. There is no winging of the scapula. He is nontender throughout the left ribs. Patient has full active range of motion of the left shoulder, has painful external rotation with crepitus. There is no weakness or neurological deficits in the left upper extremity.  Neurological: He is alert and oriented to person, place, and time.  Skin: Skin is warm and dry.  Psychiatric: He has a normal mood and affect. His behavior is normal. Judgment and thought content normal.     ED Treatments / Results  Labs (all labs ordered are listed, but only abnormal results are displayed) Labs Reviewed - No data to display  EKG  EKG Interpretation None       Radiology Dg Chest 2 View  Result Date: 01/11/2016 CLINICAL DATA:  Posterior chest pain for a few days. Pain is centrally and to the left.  Painful inspiration. Smoker. History of diabetes. EXAM: CHEST  2 VIEW COMPARISON:  CT chest 11/24/2014.  Chest 11/24/2014. FINDINGS: Normal heart size and pulmonary vascularity. No focal airspace disease or consolidation in the lungs. No blunting of costophrenic angles. No pneumothorax. Mediastinal contours appear intact. Sclerosis demonstrated in the right anterior second rib consistent with bone island as seen on previous studies. IMPRESSION: No active cardiopulmonary disease. Electronically Signed   By: Lucienne Capers M.D.   On: 01/11/2016 20:08    Procedures Procedures (including critical care time)  Medications Ordered in ED Medications  ketorolac (TORADOL) 30 MG/ML injection 30 mg (30 mg Intramuscular Given 01/11/16 2044)  orphenadrine (NORFLEX) injection 60 mg (60 mg Intramuscular Given 01/11/16 2043)     Initial Impression / Assessment and Plan / ED Course  I have reviewed the triage vital signs and the nursing notes.  Pertinent labs & imaging results that were available during my care of the patient were reviewed by me and considered in my medical decision making (see chart  for details).  Clinical Course    60 year old male with left superior medial scapular border muscular pain. Patient has sharp muscular pain with taking a deep breath. No trauma or injury. Chest x-ray shows no pneumothorax or rib injury. Vital signs are normal. EKG was normal. Patient given Toradol 30 mg IM along with Norflex IM. Patient with improvement of left posterior shoulder pain and left superior medial scapular border pain. Patient given a prescription for Flexeril and Norco. He will continue with naproxen. Follow-up with PCP or orthopedics if no improvement. Return to the ER for any worsening symptoms urgent changes in his health.  Final Clinical Impressions(s) / ED Diagnoses   Final diagnoses:  Left-sided thoracic back pain  Muscle spasm of back    New Prescriptions Discharge Medication List as of  01/11/2016  9:23 PM    START taking these medications   Details  HYDROcodone-acetaminophen (NORCO) 5-325 MG tablet Take 1 tablet by mouth every 6 (six) hours as needed for moderate pain., Starting Mon 01/11/2016, Print         Duanne Guess, PA-C 01/11/16 2131    Duanne Guess, PA-C 01/11/16 2134    Merlyn Lot, MD 01/11/16 [phone]

## 2016-01-11 NOTE — ED Provider Notes (Signed)
EKG reviewed and interpreted by myself shows normal sinus rhythm at 61 bpm, narrow QRS, normal axis, normal intervals, no ST changes. Overall normal EKG.   Harvest Dark, MD 01/11/16 2131

## 2016-01-11 NOTE — ED Notes (Signed)
Discharge instructions reviewed with patient. Questions fielded by this RN. Patient verbalizes understanding of instructions. Patient discharged home in stable condition per [facility] . No acute distress noted at time of discharge.

## 2016-01-11 NOTE — Discharge Instructions (Signed)
Please take medications as prescribed. Return to the ER for any increased pain, shortness of breath, worsening symptoms urgent changes in her health. Follow-up with orthopedics or PCP in 5-7 days if no improvement.

## 2016-03-14 ENCOUNTER — Encounter: Payer: Self-pay | Admitting: Certified Registered Nurse Anesthetist

## 2016-03-14 ENCOUNTER — Encounter: Admission: RE | Payer: Self-pay | Source: Ambulatory Visit

## 2016-03-14 ENCOUNTER — Ambulatory Visit
Admission: RE | Admit: 2016-03-14 | Payer: BLUE CROSS/BLUE SHIELD | Source: Ambulatory Visit | Admitting: Gastroenterology

## 2016-03-14 SURGERY — COLONOSCOPY
Anesthesia: General

## 2016-05-12 ENCOUNTER — Other Ambulatory Visit: Payer: Self-pay | Admitting: Internal Medicine

## 2016-05-12 DIAGNOSIS — M545 Low back pain: Secondary | ICD-10-CM

## 2016-05-12 DIAGNOSIS — M5136 Other intervertebral disc degeneration, lumbar region: Secondary | ICD-10-CM

## 2016-05-12 DIAGNOSIS — G8929 Other chronic pain: Secondary | ICD-10-CM

## 2016-05-12 DIAGNOSIS — M5117 Intervertebral disc disorders with radiculopathy, lumbosacral region: Secondary | ICD-10-CM

## 2016-11-21 ENCOUNTER — Ambulatory Visit
Admission: EM | Admit: 2016-11-21 | Discharge: 2016-11-21 | Disposition: A | Discharge status: Home / Self Care | Payer: 59 | Attending: Family Medicine | Admitting: Family Medicine

## 2016-11-21 ENCOUNTER — Encounter: Payer: Self-pay | Admitting: Gynecology

## 2016-11-21 DIAGNOSIS — K641 Second degree hemorrhoids: Secondary | ICD-10-CM

## 2016-11-21 DIAGNOSIS — K644 Residual hemorrhoidal skin tags: Secondary | ICD-10-CM

## 2016-11-21 HISTORY — DX: Other cervical disc degeneration, unspecified cervical region: M50.30

## 2016-11-21 MED ORDER — PREDNISONE 20 MG PO TABS: 20.0000 mg | ORAL_TABLET | Freq: Every day | ORAL | 0 refills | Status: DC

## 2016-11-21 MED ORDER — TRIAMCINOLONE ACETONIDE 0.1 % EX OINT: 1.0000 "application " | TOPICAL_OINTMENT | Freq: Two times a day (BID) | CUTANEOUS | 0 refills | Status: DC

## 2016-11-21 MED ORDER — PREDNISONE 20 MG PO TABS
20.0000 mg | ORAL_TABLET | Freq: Once | ORAL | Status: AC
  Administered 2016-11-21: 20 mg via ORAL

## 2016-11-21 NOTE — ED Provider Notes (Signed)
[facility]    CSN: [id number] Arrival date & time: 11/21/16  1820     History   Chief Complaint Chief Complaint  Patient presents with  . Hemorrhoids    HPI Jeremiah Mueller is a 61 y.o. male.   61 yo male with a c/o bleeding hemorrhoid for the last 3-4 days. Denies any fevers, chills. States gets constipated. Has tried Preparation H without relief.    The history is provided by the patient.    Past Medical History:  Diagnosis Date  . DDD (degenerative disc disease), cervical   . Diabetes mellitus without complication ([place])   . Hypercholesterolemia     There are no active problems to display for this patient.   Past Surgical History:  Procedure Laterality Date  . APPENDECTOMY    . colonscopy         Home Medications    Prior to Admission medications   Medication Sig Start Date End Date Taking? Authorizing Provider  metFORMIN (GLUCOPHAGE) 1000 MG tablet Take 1,000 mg by mouth 2 (two) times daily with a meal.   Yes [provider]  cyclobenzaprine (FLEXERIL) 5 MG tablet Take 1-2 tablets (5-10 mg total) by mouth 3 (three) times daily as needed for muscle spasms. 01/11/16   Duanne Guess, PA-C  diazepam (VALIUM) 5 MG tablet Take 1 tablet (5 mg total) by mouth every 8 (eight) hours as needed. 09/03/12   Kirichenko, Lahoma Rocker, PA-C  erythromycin ophthalmic ointment Place a 1/2 inch ribbon of ointment into the lower eyelid QID x 1-2 weeks. 07/18/15   Lorin Picket, PA-C  HYDROcodone-acetaminophen (NORCO) 5-325 MG tablet Take 1 tablet by mouth every 6 (six) hours as needed for moderate pain. 01/11/16   Duanne Guess, PA-C  lovastatin (MEVACOR) 40 MG tablet Take 40 mg by mouth at bedtime.    [provider]  metFORMIN (GLUCOPHAGE) 500 MG tablet Take 1 tablet (500 mg total) by mouth 2 (two) times daily with a meal. 07/18/15   Lorin Picket, PA-C  mupirocin ointment (BACTROBAN) 2 % Apply 1 application topically 3 (three) times daily.  07/18/15   Lorin Picket, PA-C  naproxen (NAPROSYN) 500 MG tablet Take 1 tablet (500 mg total) by mouth 2 (two) times daily with a meal. 09/03/12   Kirichenko, Lahoma Rocker, PA-C  predniSONE (DELTASONE) 20 MG tablet Take 1 tablet (20 mg total) by mouth daily. 11/21/16   Norval Gable, MD  sulfamethoxazole-trimethoprim (BACTRIM DS,SEPTRA DS) 800-160 MG tablet Take 1 tablet by mouth 2 (two) times daily. 07/18/15   Lorin Picket, PA-C  triamcinolone ointment (KENALOG) 0.1 % Apply 1 application topically 2 (two) times daily. 11/21/16   Norval Gable, MD    Family History Family History  Problem Relation Age of Onset  . Kidney disease Mother   . Kidney disease Father   . Diabetes Father   . Hypertension Father     Social History Social History  Substance Use Topics  . Smoking status: Current Every Day Smoker  . Smokeless tobacco: Never Used  . Alcohol use No     Allergies   Patient has no known allergies.   Review of Systems Review of Systems   Physical Exam Triage Vital Signs ED Triage Vitals  Enc Vitals Group     BP 11/21/16 1924 107/73     Pulse Rate 11/21/16 1924 60     Resp 11/21/16 1924 16     Temp 11/21/16 1924 98.3 F (36.8 C)  Temp Source 11/21/16 1924 Oral     SpO2 11/21/16 1924 100 %     Weight 11/21/16 1919 140 lb (63.5 kg)     Height 11/21/16 1919 5\' 8"  (1.727 m)     Head Circumference --      Peak Flow --      Pain Score 11/21/16 1919 10     Pain Loc --      Pain Edu? --      Excl. in [place]? --    No data found.   Updated Vital Signs BP 107/73 (BP Location: Left Arm)   Pulse 60   Temp 98.3 F (36.8 C) (Oral)   Resp 16   Ht 5\' 8"  (1.727 m)   Wt 140 lb (63.5 kg)   SpO2 100%   BMI 21.29 kg/m   Visual Acuity Right Eye Distance:   Left Eye Distance:   Bilateral Distance:    Right Eye Near:   Left Eye Near:    Bilateral Near:     Physical Exam  Constitutional: He appears well-developed and well-nourished. No distress.  Abdominal: Soft.  Bowel sounds are normal.  Genitourinary: Rectal exam shows external hemorrhoid (tender, inflamed).  Skin: He is not diaphoretic.  Vitals reviewed.    UC Treatments / Results  Labs (all labs ordered are listed, but only abnormal results are displayed) Labs Reviewed - No data to display  EKG  EKG Interpretation None       Radiology No results found.  Procedures Procedures (including critical care time)  Medications Ordered in UC Medications  predniSONE (DELTASONE) tablet 20 mg (not administered)     Initial Impression / Assessment and Plan / UC Course  I have reviewed the triage vital signs and the nursing notes.  Pertinent labs & imaging results that were available during my care of the patient were reviewed by me and considered in my medical decision making (see chart for details).       Final Clinical Impressions(s) / UC Diagnoses   Final diagnoses:  Hemorrhoids, external    New Prescriptions New Prescriptions   PREDNISONE (DELTASONE) 20 MG TABLET    Take 1 tablet (20 mg total) by mouth daily.   TRIAMCINOLONE OINTMENT (KENALOG) 0.1 %    Apply 1 application topically 2 (two) times daily.   1. diagnosis reviewed with patient 2. rx as per orders above; reviewed possible side effects, interactions, risks and benefits  3. Recommend supportive treatment with sitz baths, increased fluids/fiber; relief of pressure to area  4. Follow-up prn if symptoms worsen or don't improve   Norval Gable, MD 11/21/16 2004

## 2016-11-21 NOTE — ED Triage Notes (Signed)
Patient c/o bleeding Hemorrhoids x 3-4 days. Per patient painful to sit.

## 2018-06-02 ENCOUNTER — Other Ambulatory Visit: Payer: Self-pay

## 2018-06-02 ENCOUNTER — Emergency Department
Admission: EM | Admit: 2018-06-02 | Discharge: 2018-06-02 | Disposition: A | Discharge status: Home / Self Care | Payer: BLUE CROSS/BLUE SHIELD | Attending: Emergency Medicine | Admitting: Emergency Medicine

## 2018-06-02 ENCOUNTER — Encounter: Payer: Self-pay | Admitting: Emergency Medicine

## 2018-06-02 DIAGNOSIS — Z7984 Long term (current) use of oral hypoglycemic drugs: Secondary | ICD-10-CM | POA: Diagnosis not present

## 2018-06-02 DIAGNOSIS — Z79899 Other long term (current) drug therapy: Secondary | ICD-10-CM | POA: Insufficient documentation

## 2018-06-02 DIAGNOSIS — M75102 Unspecified rotator cuff tear or rupture of left shoulder, not specified as traumatic: Secondary | ICD-10-CM | POA: Insufficient documentation

## 2018-06-02 DIAGNOSIS — E119 Type 2 diabetes mellitus without complications: Secondary | ICD-10-CM | POA: Diagnosis not present

## 2018-06-02 DIAGNOSIS — M25512 Pain in left shoulder: Secondary | ICD-10-CM | POA: Diagnosis present

## 2018-06-02 DIAGNOSIS — F17200 Nicotine dependence, unspecified, uncomplicated: Secondary | ICD-10-CM | POA: Insufficient documentation

## 2018-06-02 MED ORDER — KETOROLAC TROMETHAMINE 60 MG/2ML IM SOLN
60.0000 mg | Freq: Once | INTRAMUSCULAR | Status: AC
  Administered 2018-06-02: 60 mg via INTRAMUSCULAR
  Filled 2018-06-02: qty 2

## 2018-06-02 MED ORDER — IBUPROFEN 600 MG PO TABS: 600.0000 mg | ORAL_TABLET | Freq: Three times a day (TID) | ORAL | 0 refills | Status: DC | PRN

## 2018-06-02 MED ORDER — OXYCODONE-ACETAMINOPHEN 7.5-325 MG PO TABS: 1.0000 | ORAL_TABLET | Freq: Four times a day (QID) | ORAL | 0 refills | Status: DC | PRN

## 2018-06-02 MED ORDER — HYDROMORPHONE HCL 1 MG/ML IJ SOLN
1.0000 mg | Freq: Once | INTRAMUSCULAR | Status: AC
  Administered 2018-06-02: 1 mg via INTRAMUSCULAR
  Filled 2018-06-02: qty 1

## 2018-06-02 NOTE — ED Triage Notes (Signed)
Pt to ED via POV c/o left shoulder pain. Pt states that pain extends into the left shoulder blade and his neck.Pt states that he has known rotator cuff issues but pain has never been this severe. Pt is in NAD but does appear uncomfortable.

## 2018-06-02 NOTE — ED Provider Notes (Signed)
[facility] Emergency Department Provider Note   ____________________________________________   First MD Initiated Contact with Patient 06/02/18 1040     (approximate)  I have reviewed the triage vital signs and the nursing notes.   HISTORY  Chief Complaint Shoulder Pain    HPI Jeremiah Mueller is a 63 y.o. male patient presents with left shoulder pain.  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  Patient state he was offered a surgical option 2 years ago but did not take it.  Patient state pain has worsened in the past week.  Patient states decreased range of motion to left upper extremity in all feels.  Patient rates the pain as a 10/10.  Past Medical History:  Diagnosis Date  . DDD (degenerative disc disease), cervical   . Diabetes mellitus without complication ([place])   . Hypercholesterolemia     There are no active problems to display for this patient.   Past Surgical History:  Procedure Laterality Date  . APPENDECTOMY    . colonscopy      Prior to Admission medications   Medication Sig Start Date End Date Taking? Authorizing Provider  cyclobenzaprine (FLEXERIL) 5 MG tablet Take 1-2 tablets (5-10 mg total) by mouth 3 (three) times daily as needed for muscle spasms. 01/11/16   Duanne Guess, PA-C  diazepam (VALIUM) 5 MG tablet Take 1 tablet (5 mg total) by mouth every 8 (eight) hours as needed. 09/03/12   Kirichenko, Lahoma Rocker, PA-C  erythromycin ophthalmic ointment Place a 1/2 inch ribbon of ointment into the lower eyelid QID x 1-2 weeks. 07/18/15   Lorin Picket, PA-C  HYDROcodone-acetaminophen (NORCO) 5-325 MG tablet Take 1 tablet by mouth every 6 (six) hours as needed for moderate pain. 01/11/16   Duanne Guess, PA-C  ibuprofen (ADVIL,MOTRIN) 600 MG tablet Take 1 tablet (600 mg total) by mouth every 8 (eight) hours as needed. 06/02/18   Sable Feil, PA-C  lovastatin  (MEVACOR) 40 MG tablet Take 40 mg by mouth at bedtime.    [provider]  metFORMIN (GLUCOPHAGE) 1000 MG tablet Take 1,000 mg by mouth 2 (two) times daily with a meal.    [provider]  metFORMIN (GLUCOPHAGE) 500 MG tablet Take 1 tablet (500 mg total) by mouth 2 (two) times daily with a meal. 07/18/15   Lorin Picket, PA-C  mupirocin ointment (BACTROBAN) 2 % Apply 1 application topically 3 (three) times daily. 07/18/15   Lorin Picket, PA-C  naproxen (NAPROSYN) 500 MG tablet Take 1 tablet (500 mg total) by mouth 2 (two) times daily with a meal. 09/03/12   Kirichenko, Lahoma Rocker, PA-C  oxyCODONE-acetaminophen (PERCOCET) 7.5-325 MG tablet Take 1 tablet by mouth every 6 (six) hours as needed. 06/02/18   Sable Feil, PA-C  predniSONE (DELTASONE) 20 MG tablet Take 1 tablet (20 mg total) by mouth daily. 11/21/16   Norval Gable, MD  sulfamethoxazole-trimethoprim (BACTRIM DS,SEPTRA DS) 800-160 MG tablet Take 1 tablet by mouth 2 (two) times daily. 07/18/15   Lorin Picket, PA-C  triamcinolone ointment (KENALOG) 0.1 % Apply 1 application topically 2 (two) times daily. 11/21/16   Norval Gable, MD    Allergies Patient has no known allergies.  Family History  Problem Relation Age of Onset  . Kidney disease Mother   . Kidney disease Father   . Diabetes Father   . Hypertension Father     Social History  Social History   Tobacco Use  . Smoking status: Current Every Day Smoker  . Smokeless tobacco: Never Used  Substance Use Topics  . Alcohol use: No  . Drug use: No    Review of Systems Constitutional: No fever/chills Eyes: No visual changes. ENT: No sore throat. Cardiovascular: Denies chest pain. Respiratory: Denies shortness of breath. Gastrointestinal: No abdominal pain.  No nausea, no vomiting.  No diarrhea.  No constipation. Genitourinary: Negative for dysuria. Musculoskeletal: Negative for back pain. Skin: Negative for rash. Neurological: Negative for  headaches, focal weakness or numbness. Endocrine:Diabetes and hyperlipidemia. ____________________________________________   PHYSICAL EXAM:  VITAL SIGNS: ED Triage Vitals  Enc Vitals Group     BP 06/02/18 1019 (!) 141/85     Pulse Rate 06/02/18 1019 81     Resp 06/02/18 1019 18     Temp 06/02/18 1019 98.9 F (37.2 C)     Temp Source 06/02/18 1019 Oral     SpO2 06/02/18 1019 97 %     Weight --      Height --      Head Circumference --      Peak Flow --      Pain Score 06/02/18 1026 10     Pain Loc --      Pain Edu? --      Excl. in [place]? --    Constitutional: Alert and oriented.  Moderate distress.   Neck: No cervical spine tenderness to palpation. Cardiovascular: Normal rate, regular rhythm. Grossly normal heart sounds.  Good peripheral circulation. Respiratory: Normal respiratory effort.  No retractions. Lungs CTAB. Musculoskeletal: No obvious deformity to the left upper extremity.  Patient has decreased range of motion with abduction overhead reaching.  Patient strength against resistance is 2/5.   Neurologic:  Normal speech and language. No gross focal neurologic deficits are appreciated. No gait instability. Skin:  Skin is warm, dry and intact. No rash noted. Psychiatric: Mood and affect are normal. Speech and behavior are normal.  ____________________________________________   LABS (all labs ordered are listed, but only abnormal results are displayed)  Labs Reviewed - No data to display ____________________________________________  EKG   ____________________________________________  RADIOLOGY  ED MD interpretation:    Official radiology report(s): No results found.  ____________________________________________   PROCEDURES  Procedure(s) performed: None  Procedures  Critical Care performed: No  ____________________________________________   INITIAL IMPRESSION / ASSESSMENT AND PLAN / ED COURSE  As part of my medical decision making, I reviewed  the following data within the [place]   Patient presents with left shoulder pain.  Review of patient MRI which was 2 years ago reveal partial rotator cuff tear.  Patient advised that definitive treatment would cannot be done by orthopedic.  Patient placed in arm sling and advised take medication as directed.  Advised to contact orthopedic department 3 days to schedule appointment.       ____________________________________________   FINAL CLINICAL IMPRESSION(S) / ED DIAGNOSES  Final diagnoses:  Nontraumatic tear of left rotator cuff, unspecified tear extent     ED Discharge Orders         Ordered    oxyCODONE-acetaminophen (PERCOCET) 7.5-325 MG tablet  Every 6 hours PRN     06/02/18 1053    ibuprofen (ADVIL,MOTRIN) 600 MG tablet  Every 8 hours PRN     06/02/18 1053           Note:  This document was prepared using Dragon voice recognition software and may include unintentional  dictation errors.    Sable Feil, PA-C 06/02/18 [place], Randall An, MD 06/02/18 1351

## 2018-06-02 NOTE — ED Notes (Signed)
See triage note  Presents with left shoulder pain pain  States he thinks it may be rotator cuff problem  Pain became increased yesterday  Limited ROM d/t pain

## 2018-06-02 NOTE — Discharge Instructions (Signed)
Advised to follow orthopedic for definitive evaluation and treatment.

## 2019-04-25 ENCOUNTER — Emergency Department
Admission: EM | Admit: 2019-04-25 | Discharge: 2019-04-25 | Disposition: A | Discharge status: Home / Self Care | Payer: BC Managed Care – PPO | Attending: Emergency Medicine | Admitting: Emergency Medicine

## 2019-04-25 ENCOUNTER — Emergency Department: Payer: BC Managed Care – PPO

## 2019-04-25 ENCOUNTER — Other Ambulatory Visit: Payer: Self-pay

## 2019-04-25 DIAGNOSIS — Y929 Unspecified place or not applicable: Secondary | ICD-10-CM | POA: Diagnosis not present

## 2019-04-25 DIAGNOSIS — E119 Type 2 diabetes mellitus without complications: Secondary | ICD-10-CM | POA: Insufficient documentation

## 2019-04-25 DIAGNOSIS — Y939 Activity, unspecified: Secondary | ICD-10-CM | POA: Diagnosis not present

## 2019-04-25 DIAGNOSIS — Z7984 Long term (current) use of oral hypoglycemic drugs: Secondary | ICD-10-CM | POA: Diagnosis not present

## 2019-04-25 DIAGNOSIS — F17210 Nicotine dependence, cigarettes, uncomplicated: Secondary | ICD-10-CM | POA: Diagnosis not present

## 2019-04-25 DIAGNOSIS — S46001A Unspecified injury of muscle(s) and tendon(s) of the rotator cuff of right shoulder, initial encounter: Secondary | ICD-10-CM | POA: Diagnosis not present

## 2019-04-25 DIAGNOSIS — S46009A Unspecified injury of muscle(s) and tendon(s) of the rotator cuff of unspecified shoulder, initial encounter: Secondary | ICD-10-CM

## 2019-04-25 DIAGNOSIS — Z79899 Other long term (current) drug therapy: Secondary | ICD-10-CM | POA: Diagnosis not present

## 2019-04-25 DIAGNOSIS — Y990 Civilian activity done for income or pay: Secondary | ICD-10-CM | POA: Diagnosis not present

## 2019-04-25 DIAGNOSIS — X500XXA Overexertion from strenuous movement or load, initial encounter: Secondary | ICD-10-CM | POA: Insufficient documentation

## 2019-04-25 DIAGNOSIS — S4991XA Unspecified injury of right shoulder and upper arm, initial encounter: Secondary | ICD-10-CM | POA: Diagnosis present

## 2019-04-25 MED ORDER — OXYCODONE-ACETAMINOPHEN 5-325 MG PO TABS
1.0000 | ORAL_TABLET | Freq: Once | ORAL | Status: AC
  Administered 2019-04-25: 1 via ORAL
  Filled 2019-04-25: qty 1

## 2019-04-25 MED ORDER — FENTANYL CITRATE (PF) 100 MCG/2ML IJ SOLN
50.0000 ug | INTRAMUSCULAR | Status: DC | PRN
  Administered 2019-04-25: 50 ug via INTRAVENOUS
  Filled 2019-04-25: qty 2

## 2019-04-25 MED ORDER — HYDROCODONE-ACETAMINOPHEN 5-325 MG PO TABS: 1.0000 | ORAL_TABLET | Freq: Four times a day (QID) | ORAL | 0 refills | Status: DC | PRN

## 2019-04-25 NOTE — ED Notes (Signed)
Patient reports he has shoulder immobilizer at home and will just use that one.

## 2019-04-25 NOTE — ED Provider Notes (Signed)
[facility] Emergency Department Provider Note  ____________________________________________   First MD Initiated Contact with Patient 04/25/19 1422     (approximate)  I have reviewed the triage vital signs and the nursing notes.   HISTORY  Chief Complaint Shoulder Injury    HPI Jeremiah Mueller is a 63 y.o. male presents emergency department complaining of right shoulder pain.  States he has a history of rotator cuff tears in both shoulders.  States today he was at work and started pulling on something and felt a snapping type pain in the right shoulder.  No numbness or tingling.  No neck pain.  Pain does radiate into the muscle leading to the neck.  No chest pain shortness of breath.    Past Medical History:  Diagnosis Date  . DDD (degenerative disc disease), cervical   . Diabetes mellitus without complication ([place])   . Hypercholesterolemia     There are no problems to display for this patient.   Past Surgical History:  Procedure Laterality Date  . APPENDECTOMY    . colonscopy      Prior to Admission medications   Medication Sig Start Date End Date Taking? Authorizing Provider  diazepam (VALIUM) 5 MG tablet Take 1 tablet (5 mg total) by mouth every 8 (eight) hours as needed. 09/03/12   Kirichenko, Lahoma Rocker, PA-C  HYDROcodone-acetaminophen (NORCO/VICODIN) 5-325 MG tablet Take 1 tablet by mouth every 6 (six) hours as needed for moderate pain. 04/25/19   Mayetta Castleman, Linden Dolin, PA-C  lovastatin (MEVACOR) 40 MG tablet Take 40 mg by mouth at bedtime.    [provider]  metFORMIN (GLUCOPHAGE) 1000 MG tablet Take 1,000 mg by mouth 2 (two) times daily with a meal.    [provider]  metFORMIN (GLUCOPHAGE) 500 MG tablet Take 1 tablet (500 mg total) by mouth 2 (two) times daily with a meal. 07/18/15   Lorin Picket, PA-C    Allergies Patient has no known allergies.  Family History  Problem Relation Age of Onset  . Kidney disease  Mother   . Kidney disease Father   . Diabetes Father   . Hypertension Father     Social History Social History   Tobacco Use  . Smoking status: Current Every Day Smoker  . Smokeless tobacco: Never Used  Substance Use Topics  . Alcohol use: No  . Drug use: No    Review of Systems  Constitutional: No fever/chills Eyes: No visual changes. ENT: No sore throat. Respiratory: Denies cough Genitourinary: Negative for dysuria. Musculoskeletal: Negative for back pain.  Positive for right shoulder pain Skin: Negative for rash.    ____________________________________________   PHYSICAL EXAM:  VITAL SIGNS: ED Triage Vitals [04/25/19 1226]  Enc Vitals Group     BP (!) 141/72     Pulse Rate 81     Resp 18     Temp 98.8 F (37.1 C)     Temp Source Oral     SpO2 100 %     Weight 145 lb (65.8 kg)     Height 5\' 7"  (1.702 m)     Head Circumference      Peak Flow      Pain Score 10     Pain Loc      Pain Edu?      Excl. in [place]?     Constitutional: Alert and oriented. Well appearing and in no acute distress. Eyes: Conjunctivae are normal.  Head: Atraumatic. Nose: No congestion/rhinnorhea. Mouth/Throat: Mucous  membranes are moist.   Neck:  supple no lymphadenopathy noted Cardiovascular: Normal rate, regular rhythm. Heart sounds are normal Respiratory: Normal respiratory effort.  No retractions, lungs c t a  GU: deferred Musculoskeletal: Decreased range of motion of the right shoulder, tender at the rotator cuff and joint, neurovascular is intact, no bruising noted neurologic:  Normal speech and language.  Skin:  Skin is warm, dry and intact. No rash noted. Psychiatric: Mood and affect are normal. Speech and behavior are normal.  ____________________________________________   LABS (all labs ordered are listed, but only abnormal results are displayed)  Labs Reviewed - No data to  display ____________________________________________   ____________________________________________  RADIOLOGY  X-ray of the right shoulder is negative  ____________________________________________   PROCEDURES  Procedure(s) performed: Shoulder immobilizer applied   Procedures    ____________________________________________   INITIAL IMPRESSION / ASSESSMENT AND PLAN / ED COURSE  Pertinent labs & imaging results that were available during my care of the patient were reviewed by me and considered in my medical decision making (see chart for details).   Patient 63 year old male presents emergency department complaint of right shoulder pain.  Physical exam shows the right shoulder to be tender.  Decreased range of motion secondary to pain.  No bruising noted.  Neurovascular is intact.  X-ray of the right shoulder is negative  Patient was placed in a shoulder immobilizer.  He is to follow-up with [facility] clinic orthopedics as he is already been evaluated there for shoulder pain.  He is given prescription for Vicodin.  Take over-the-counter Aleve or ibuprofen.  He was given a work note stating he should not use the right arm until evaluated by orthopedics.  He was discharged stable condition.    Jeremiah Mueller was evaluated in Emergency Department on 04/25/2019 for the symptoms described in the history of present illness. He was evaluated in the context of the global COVID-19 pandemic, which necessitated consideration that the patient might be at risk for infection with the SARS-CoV-2 virus that causes COVID-19. Institutional protocols and algorithms that pertain to the evaluation of patients at risk for COVID-19 are in a state of rapid change based on information released by regulatory bodies including the CDC and federal and state organizations. These policies and algorithms were followed during the patient's care in the ED.   As part of my medical decision making, I reviewed  the following data within the [place] notes reviewed and incorporated, Old chart reviewed, Radiograph reviewed , Notes from prior ED visits and [place] Controlled Substance Database  ____________________________________________   FINAL CLINICAL IMPRESSION(S) / ED DIAGNOSES  Final diagnoses:  Rotator cuff injury, initial encounter      NEW MEDICATIONS STARTED DURING THIS VISIT:  New Prescriptions   HYDROCODONE-ACETAMINOPHEN (NORCO/VICODIN) 5-325 MG TABLET    Take 1 tablet by mouth every 6 (six) hours as needed for moderate pain.     Note:  This document was prepared using Dragon voice recognition software and may include unintentional dictation errors.    Versie Starks, PA-C 04/25/19 1439    Earleen Newport, MD 04/25/19 1539

## 2019-04-25 NOTE — Discharge Instructions (Signed)
Follow-up with Dr. Roland Rack, please call his office for an appointment.  Take over-the-counter Aleve, Vicodin as prescribed.  Be careful with this medication as it can cause addiction problems.  Remain in the shoulder immobilizer unless showering.  You should remain out of work as she cannot use her right arm until you have been evaluated by orthopedics

## 2019-04-25 NOTE — ED Triage Notes (Signed)
First RN Note: pt presents to ED via POV with c/o R shoulder pain, pt states was pulling on something while at work and now has severe R shoulder pain.

## 2019-04-25 NOTE — ED Triage Notes (Signed)
Reports right sided shoulder pain after pulling on something at work. Hx of bilateral "bad shoulders". Pt reports extreme pain with moving right shoulder. No obvious deformity. Pt appears to be in pain. Color of right extremity WNL, cap refill WNL, sensory and motor intact, radial pulse WNL.

## 2019-10-24 ENCOUNTER — Other Ambulatory Visit: Payer: Self-pay | Admitting: Orthopedic Surgery

## 2019-10-24 DIAGNOSIS — M7582 Other shoulder lesions, left shoulder: Secondary | ICD-10-CM

## 2019-10-24 DIAGNOSIS — R29898 Other symptoms and signs involving the musculoskeletal system: Secondary | ICD-10-CM

## 2019-10-24 DIAGNOSIS — M47812 Spondylosis without myelopathy or radiculopathy, cervical region: Secondary | ICD-10-CM

## 2019-10-24 DIAGNOSIS — M5030 Other cervical disc degeneration, unspecified cervical region: Secondary | ICD-10-CM

## 2019-10-24 DIAGNOSIS — M5412 Radiculopathy, cervical region: Secondary | ICD-10-CM

## 2019-10-24 DIAGNOSIS — R200 Anesthesia of skin: Secondary | ICD-10-CM

## 2019-11-07 ENCOUNTER — Other Ambulatory Visit: Payer: Self-pay

## 2019-11-07 ENCOUNTER — Ambulatory Visit
Admission: RE | Admit: 2019-11-07 | Discharge: 2019-11-07 | Disposition: A | Discharge status: Home / Self Care | Payer: BC Managed Care – PPO | Source: Ambulatory Visit | Attending: Orthopedic Surgery | Admitting: Orthopedic Surgery

## 2019-11-07 DIAGNOSIS — R200 Anesthesia of skin: Secondary | ICD-10-CM | POA: Diagnosis present

## 2019-11-07 DIAGNOSIS — M5030 Other cervical disc degeneration, unspecified cervical region: Secondary | ICD-10-CM | POA: Diagnosis not present

## 2019-11-07 DIAGNOSIS — R29898 Other symptoms and signs involving the musculoskeletal system: Secondary | ICD-10-CM | POA: Diagnosis present

## 2019-11-07 DIAGNOSIS — M5412 Radiculopathy, cervical region: Secondary | ICD-10-CM

## 2019-11-07 DIAGNOSIS — M47812 Spondylosis without myelopathy or radiculopathy, cervical region: Secondary | ICD-10-CM

## 2019-11-07 DIAGNOSIS — M7582 Other shoulder lesions, left shoulder: Secondary | ICD-10-CM | POA: Diagnosis present

## 2019-11-08 ENCOUNTER — Ambulatory Visit: Payer: BC Managed Care – PPO

## 2019-11-13 ENCOUNTER — Other Ambulatory Visit: Admission: RE | Admit: 2019-11-13 | Payer: BC Managed Care – PPO | Source: Ambulatory Visit

## 2019-11-15 ENCOUNTER — Encounter: Admission: RE | Payer: Self-pay | Source: Home / Self Care

## 2019-11-15 ENCOUNTER — Ambulatory Visit: Admission: RE | Admit: 2019-11-15 | Payer: BC Managed Care – PPO | Source: Home / Self Care | Admitting: Surgery

## 2019-11-15 SURGERY — ARTHROPLASTY, SHOULDER, TOTAL, REVERSE
Anesthesia: Choice | Site: Shoulder | Laterality: Left

## 2019-12-03 ENCOUNTER — Other Ambulatory Visit: Payer: Self-pay

## 2019-12-03 ENCOUNTER — Encounter: Payer: BC Managed Care – PPO | Attending: Family Medicine | Admitting: Dietician

## 2019-12-03 ENCOUNTER — Encounter: Payer: Self-pay | Admitting: Dietician

## 2019-12-03 VITALS — Ht 67.0 in | Wt 140.6 lb

## 2019-12-03 DIAGNOSIS — E1169 Type 2 diabetes mellitus with other specified complication: Secondary | ICD-10-CM | POA: Diagnosis not present

## 2019-12-03 NOTE — Patient Instructions (Signed)
·   Great job making healthy diet changes, keep it up!  Keep amount of carbs to 3-4 servings, or 45-60grams with each meal.   Make sure to eat a meal or snack every 3-5 hours during the day.  Try Nature's Twist sugar free drinks, or minute maid light, or Simply Light.

## 2019-12-03 NOTE — Progress Notes (Signed)
Medical Nutrition Therapy: Visit start time: 0915  end time: 1045 Assessment:  Diagnosis: Type 2 diabetes Past medical history: hyperlipidemia; shoulder surgery upcoming Psychosocial issues/ stress concerns: none  Preferred learning method:  . Hands-on   Current weight: 140.6lbs Height: 5'7" Medications, supplements: reconciled list in medical record  Progress and evaluation:   Patient reports recent high BGs; was not taking metformin, BGs were 200-300s, and recent HbA1C was 14.3. BGs have improved with diet changes, fasting results now 100-150s.  Started eating some whole grains, stopped drinking orange juice and eating sweets. Decreased fried foods; eating less rice and potatoes  Patient recently had upper teeth extracted; will also have some of his lower teeth extracted and then get an upper plate and lower partial. He is currently eating soft foods.   He reports 2 episodes of hypoglycemic symptoms, which he treating with orange juice.   Physical activity: walking 60 minutes, 1-4x a week  Dietary Intake:  Usual eating pattern includes 3 meals and 2-3 snacks per day. Dining out frequency: 0 meals per week.  Breakfast: grits, eggs, sausage/ bacon, whole wheat toast; occ cereal; oatmeal Snack: blueberries/ banana/ peach/ melon; popcorn Lunch: sandwich grilled cheese Snack: low sugar ice cream or same as am Supper: steamed squash and onion; broccoli/ brussels spr; canned green; pinto beans; homemade chicken and veg soup with some potatoes; roast pork; bbq ribs; occ hot dogs; mostly chicken, broiled fish  Snack: loves cornbread and buttermilk, sometimes as dinner meal Beverages: gatorade zero, water, coffee (stopped lemonade)  Nutrition Care Education: Topics covered:  Basic nutrition: basic food groups, appropriate nutrient balance, appropriate meal and snack schedule, general nutrition guidelines     Advanced nutrition: food label reading for total carbs Diabetes:  goals for  BGs and HbA1C, appropriate meal and snack schedule, appropriate carb intake and balance, healthy carb choices, role of fiber, protein, fat; basic meal planning using plate method and sample menus; estimated energy needs at 1600-1800kcal, provided guidance for 45% CHO, 25% protein, 30% fat Hyperlipidemia: healthy and unhealthy fats Other: soft diet   Nutritional Diagnosis:  [place]-2.2 Altered nutrition-related laboratory As related to Type 2 diabetes.  As evidenced by patient with recent HbA1C of 14.3%. [place]-1.2 Biting/Chewing (masticatory) difficulty As related to poor dentition.  As evidenced by recent extraction of all upper teeth, and upcoming extraction of some lower teeth.  Intervention:   Instruction and discussion as noted above.  Patient and spouse have been working on significant lifestyle changes, with improvement in BGs, and regain of weight. They are motivated to continue with healthier habits.  Patient and spouse voice understanding of basic meal planning and will expand variety of foods consumed.   Goal is to continue to eat at regular intervals and follow pattern for balanced meals.  No follow-up scheduled at this time; patient will schedule later if needed.  Education Materials given:  . General diet guidelines for Diabetes . Plate Planner with food lists, sample meal pattern . Sample menus . Snacking handout . Nutrition Recommendations after oral Surgery for People with Diabetes  . Goals/ instructions   Learner/ who was taught:  . Patient  . Spouse/ partner  Level of understanding: Marland Kitchen Verbalizes/ demonstrates competency   Demonstrated degree of understanding via:   Teach back Learning barriers: . None  Willingness to learn/ readiness for change: . Eager, change in progress   Monitoring and Evaluation:  Dietary intake, exercise, BG control, and body weight      follow up: prn

## 2020-01-09 ENCOUNTER — Other Ambulatory Visit: Payer: Self-pay | Admitting: Surgery

## 2020-01-13 ENCOUNTER — Other Ambulatory Visit: Payer: Self-pay

## 2020-01-13 ENCOUNTER — Encounter
Admission: RE | Admit: 2020-01-13 | Discharge: 2020-01-13 | Disposition: A | Discharge status: Home / Self Care | Payer: BC Managed Care – PPO | Source: Ambulatory Visit | Attending: Surgery | Admitting: Surgery

## 2020-01-13 DIAGNOSIS — Z01812 Encounter for preprocedural laboratory examination: Secondary | ICD-10-CM | POA: Diagnosis not present

## 2020-01-13 DIAGNOSIS — Z01810 Encounter for preprocedural cardiovascular examination: Secondary | ICD-10-CM | POA: Diagnosis present

## 2020-01-13 LAB — COMPREHENSIVE METABOLIC PANEL
ALT: 18 U/L (ref 0–44)
AST: 18 U/L (ref 15–41)
Albumin: 4.2 g/dL (ref 3.5–5.0)
Alkaline Phosphatase: 45 U/L (ref 38–126)
Anion gap: 8 (ref 5–15)
BUN: 17 mg/dL (ref 8–23)
CO2: 27 mmol/L (ref 22–32)
Calcium: 9.1 mg/dL (ref 8.9–10.3)
Chloride: 103 mmol/L (ref 98–111)
Creatinine, Ser: 0.55 mg/dL — ABNORMAL LOW (ref 0.61–1.24)
GFR calc Af Amer: >60 mL/min (ref >60)
GFR calc non Af Amer: >60 mL/min (ref >60)
Glucose, Bld: 160 mg/dL — ABNORMAL HIGH (ref 70–99)
Potassium: 4.1 mmol/L (ref 3.5–5.1)
Sodium: 138 mmol/L (ref 135–145)
Total Bilirubin: 0.9 mg/dL (ref 0.3–1.2)
Total Protein: 6.9 g/dL (ref 6.5–8.1)

## 2020-01-13 LAB — CBC WITH DIFFERENTIAL/PLATELET
Abs Immature Granulocytes: 0.03 10*3/uL (ref 0.00–0.07)
Basophils Absolute: 0.1 10*3/uL (ref 0.0–0.1)
Basophils Relative: 1 %
Eosinophils Absolute: 0.3 10*3/uL (ref 0.0–0.5)
Eosinophils Relative: 3 %
HCT: 38.4 % — ABNORMAL LOW (ref 39.0–52.0)
Hemoglobin: 13.2 g/dL (ref 13.0–17.0)
Immature Granulocytes: 0 %
Lymphocytes Relative: 38 %
Lymphs Abs: 3.4 10*3/uL (ref 0.7–4.0)
MCH: 30.6 pg (ref 26.0–34.0)
MCHC: 34.4 g/dL (ref 30.0–36.0)
MCV: 89.1 fL (ref 80.0–100.0)
Monocytes Absolute: 0.8 10*3/uL (ref 0.1–1.0)
Monocytes Relative: 8 %
Neutro Abs: 4.5 10*3/uL (ref 1.7–7.7)
Neutrophils Relative %: 50 %
Platelets: 239 10*3/uL (ref 150–400)
RBC: 4.31 MIL/uL (ref 4.22–5.81)
RDW: 12.5 % (ref 11.5–15.5)
WBC: 9 10*3/uL (ref 4.0–10.5)
nRBC: 0 % (ref 0.0–0.2)

## 2020-01-13 LAB — URINALYSIS, ROUTINE W REFLEX MICROSCOPIC
Bilirubin Urine: NEGATIVE
Glucose, UA: NEGATIVE mg/dL
Hgb urine dipstick: NEGATIVE
Ketones, ur: NEGATIVE mg/dL
Leukocytes,Ua: NEGATIVE
Nitrite: NEGATIVE
Protein, ur: NEGATIVE mg/dL
Specific Gravity, Urine: 1.021 (ref 1.005–1.030)
pH: 5 (ref 5.0–8.0)

## 2020-01-13 LAB — SURGICAL PCR SCREEN
MRSA, PCR: NEGATIVE
Staphylococcus aureus: NEGATIVE

## 2020-01-13 NOTE — Patient Instructions (Signed)
Your procedure is scheduled on: Tues 9/7 Report to Day Surgery. To find out your arrival time please call [phone] between 1PM - 3PM on Mon 9/6.  Remember: Instructions that are not followed completely may result in serious medical risk,  up to and including death, or upon the discretion of your surgeon and anesthesiologist your  surgery may need to be rescheduled.     _X__ 1. Do not eat food after midnight the night before your procedure.                 No chewing gum or hard candies. You may drink clear liquids up to 2 hours                 before you are scheduled to arrive for your surgery- DO not drink clear                 liquids within 2 hours of the start of your surgery.                 Clear Liquids include:  water, apple juice without pulp, clear Gatorade, G2 or                  Gatorade Zero (avoid Red/Purple/Blue), Black Coffee or Tea (Do not add                 anything to coffee or tea). ___x__2.   Complete the G2. 2 hours before you arrive  __X__2.  On the morning of surgery brush your teeth with toothpaste and water, you                may rinse your mouth with mouthwash if you wish.  Do not swallow any toothpaste of mouthwash.     _X__ 3.  No Alcohol for 24 hours before or after surgery.   _X__ 4.  Do Not Smoke or use e-cigarettes For 24 Hours Prior to Your Surgery.                 Do not use any chewable tobacco products for at least 6 hours prior to                 Surgery.  ___  5.  Do not use any recreational drugs (marijuana, cocaine, heroin, ecstasy, MDMA or other)                For at least one week prior to your surgery.  Combination of these drugs with anesthesia                May have life threatening results.  ____  6.  Bring all medications with you on the day of surgery if instructed.   _x___  7.  Notify your doctor if there is any change in your medical condition      (cold, fever, infections).     Do not wear  jewelry,  Do not wear lotions,  Do not shave 48 hours prior to surgery. Men may shave face and neck. Do not bring valuables to the hospital.    [facility] is not responsible for any belongings or valuables.  Contacts, dentures or bridgework may not be worn into surgery. Leave your suitcase in the car. After surgery it may be brought to your room. For patients admitted to the hospital, discharge time is determined by your treatment team.   Patients discharged the day of surgery will not be allowed to  drive home.   Make arrangements for someone to be with you for the first 24 hours of your Same Day Discharge.    Please read over the following fact sheets that you were given:    _x___ Take these medicines the morning of surgery with A SIP OF WATER:    1. rosuvastatin (CRESTOR) 20 MG tablet  2.   3.   4.  5.  6.  ____ Fleet Enema (as directed)   _x___ Use CHG Soap (or wipes) as directed  _x___ Use Benzoyl Peroxide Gel as instructed  ____ Use inhalers on the day of surgery  __x__ Stop metformin 2 days prior to surgery  Last dose on Sat 9/4    ____ Take 1/2 of usual insulin dose the night before surgery. No insulin the morning          of surgery.   ____ Stop Coumadin/Plavix/aspirin on  __x__ Stop Anti-inflammatories   Ibuprofen aleve or aspirin after tomorrow   May take tylenol   ____ Stop supplements until after surgery.    ____ Bring C-Pap to the hospital.    If you have any questions regarding your pre-procedure instructions,  Please call Pre-admit Testing at [place]

## 2020-01-14 ENCOUNTER — Other Ambulatory Visit: Payer: Self-pay | Admitting: Surgery

## 2020-01-17 ENCOUNTER — Other Ambulatory Visit: Payer: Self-pay

## 2020-01-17 ENCOUNTER — Other Ambulatory Visit
Admission: RE | Admit: 2020-01-17 | Discharge: 2020-01-17 | Disposition: A | Discharge status: Home / Self Care | Payer: BC Managed Care – PPO | Source: Ambulatory Visit | Attending: Surgery | Admitting: Surgery

## 2020-01-17 DIAGNOSIS — Z20822 Contact with and (suspected) exposure to covid-19: Secondary | ICD-10-CM | POA: Insufficient documentation

## 2020-01-17 DIAGNOSIS — Z01812 Encounter for preprocedural laboratory examination: Secondary | ICD-10-CM | POA: Diagnosis not present

## 2020-01-17 LAB — TYPE AND SCREEN
ABO/RH(D): A POS
Antibody Screen: NEGATIVE

## 2020-01-17 LAB — SARS CORONAVIRUS 2 (TAT 6-24 HRS): SARS Coronavirus 2: NEGATIVE

## 2020-01-20 MED ORDER — CEFAZOLIN SODIUM-DEXTROSE 2-4 GM/100ML-% IV SOLN
2.0000 g | INTRAVENOUS | Status: AC
  Administered 2020-01-21: 2 g via INTRAVENOUS

## 2020-01-21 ENCOUNTER — Ambulatory Visit: Payer: BC Managed Care – PPO

## 2020-01-21 ENCOUNTER — Ambulatory Visit: Payer: BC Managed Care – PPO | Admitting: Urgent Care

## 2020-01-21 ENCOUNTER — Ambulatory Visit
Admission: RE | Admit: 2020-01-21 | Discharge: 2020-01-21 | Disposition: A | Discharge status: Home / Self Care | Payer: BC Managed Care – PPO | Attending: Surgery | Admitting: Surgery

## 2020-01-21 ENCOUNTER — Encounter
Admission: RE | Disposition: A | Discharge status: Home / Self Care | Payer: Self-pay | Source: Home / Self Care | Attending: Surgery

## 2020-01-21 ENCOUNTER — Encounter: Payer: Self-pay | Admitting: Surgery

## 2020-01-21 ENCOUNTER — Other Ambulatory Visit: Payer: Self-pay

## 2020-01-21 ENCOUNTER — Ambulatory Visit: Payer: BC Managed Care – PPO | Admitting: Registered Nurse

## 2020-01-21 DIAGNOSIS — M19012 Primary osteoarthritis, left shoulder: Secondary | ICD-10-CM | POA: Diagnosis not present

## 2020-01-21 DIAGNOSIS — G5602 Carpal tunnel syndrome, left upper limb: Secondary | ICD-10-CM | POA: Diagnosis not present

## 2020-01-21 DIAGNOSIS — Z7984 Long term (current) use of oral hypoglycemic drugs: Secondary | ICD-10-CM | POA: Diagnosis not present

## 2020-01-21 DIAGNOSIS — R52 Pain, unspecified: Secondary | ICD-10-CM

## 2020-01-21 DIAGNOSIS — M75122 Complete rotator cuff tear or rupture of left shoulder, not specified as traumatic: Secondary | ICD-10-CM | POA: Insufficient documentation

## 2020-01-21 DIAGNOSIS — M778 Other enthesopathies, not elsewhere classified: Secondary | ICD-10-CM | POA: Insufficient documentation

## 2020-01-21 DIAGNOSIS — Z79899 Other long term (current) drug therapy: Secondary | ICD-10-CM | POA: Insufficient documentation

## 2020-01-21 DIAGNOSIS — E119 Type 2 diabetes mellitus without complications: Secondary | ICD-10-CM | POA: Insufficient documentation

## 2020-01-21 DIAGNOSIS — F17290 Nicotine dependence, other tobacco product, uncomplicated: Secondary | ICD-10-CM | POA: Insufficient documentation

## 2020-01-21 DIAGNOSIS — Z96612 Presence of left artificial shoulder joint: Secondary | ICD-10-CM

## 2020-01-21 DIAGNOSIS — M75102 Unspecified rotator cuff tear or rupture of left shoulder, not specified as traumatic: Secondary | ICD-10-CM | POA: Diagnosis present

## 2020-01-21 HISTORY — PX: REVERSE SHOULDER ARTHROPLASTY: SHX5054

## 2020-01-21 HISTORY — PX: CARPAL TUNNEL RELEASE: SHX101

## 2020-01-21 LAB — GLUCOSE, CAPILLARY
Glucose-Capillary: 126 mg/dL — ABNORMAL HIGH (ref 70–99)
Glucose-Capillary: 163 mg/dL — ABNORMAL HIGH (ref 70–99)

## 2020-01-21 SURGERY — ARTHROPLASTY, SHOULDER, TOTAL, REVERSE
Anesthesia: Regional | Site: Wrist | Laterality: Left

## 2020-01-21 MED ORDER — CHLORHEXIDINE GLUCONATE 0.12 % MT SOLN
OROMUCOSAL | Status: AC
  Administered 2020-01-21: 15 mL via OROMUCOSAL
  Filled 2020-01-21: qty 15

## 2020-01-21 MED ORDER — PROPOFOL 10 MG/ML IV BOLUS
INTRAVENOUS | Status: AC
  Filled 2020-01-21: qty 20

## 2020-01-21 MED ORDER — OXYCODONE HCL 5 MG PO TABS: 5.0000 mg | ORAL_TABLET | ORAL | Status: DC | PRN

## 2020-01-21 MED ORDER — ACETAMINOPHEN 10 MG/ML IV SOLN
INTRAVENOUS | Status: AC
  Filled 2020-01-21: qty 100

## 2020-01-21 MED ORDER — BUPIVACAINE LIPOSOME 1.3 % IJ SUSP
INTRAMUSCULAR | Status: AC
  Filled 2020-01-21: qty 20

## 2020-01-21 MED ORDER — PHENYLEPHRINE HCL (PRESSORS) 10 MG/ML IV SOLN
INTRAVENOUS | Status: DC | PRN
  Administered 2020-01-21: 100 ug via INTRAVENOUS

## 2020-01-21 MED ORDER — KETOROLAC TROMETHAMINE 15 MG/ML IJ SOLN: 7.5000 mg | Freq: Four times a day (QID) | INTRAMUSCULAR | Status: DC

## 2020-01-21 MED ORDER — ONDANSETRON HCL 4 MG PO TABS: 4.0000 mg | ORAL_TABLET | Freq: Four times a day (QID) | ORAL | Status: DC | PRN

## 2020-01-21 MED ORDER — BUPIVACAINE-EPINEPHRINE (PF) 0.5% -1:200000 IJ SOLN
INTRAMUSCULAR | Status: DC | PRN
  Administered 2020-01-21: 30 mL

## 2020-01-21 MED ORDER — TRANEXAMIC ACID 1000 MG/10ML IV SOLN
INTRAVENOUS | Status: AC
  Filled 2020-01-21: qty 10

## 2020-01-21 MED ORDER — KETOROLAC TROMETHAMINE 15 MG/ML IJ SOLN: 15.0000 mg | Freq: Once | INTRAMUSCULAR | Status: DC

## 2020-01-21 MED ORDER — ORAL CARE MOUTH RINSE: 15.0000 mL | Freq: Once | OROMUCOSAL | Status: AC

## 2020-01-21 MED ORDER — ROCURONIUM BROMIDE 100 MG/10ML IV SOLN
INTRAVENOUS | Status: DC | PRN
  Administered 2020-01-21 (×2): 20 mg via INTRAVENOUS
  Administered 2020-01-21: 50 mg via INTRAVENOUS

## 2020-01-21 MED ORDER — CEFAZOLIN SODIUM-DEXTROSE 2-4 GM/100ML-% IV SOLN: 2.0000 g | Freq: Four times a day (QID) | INTRAVENOUS | Status: DC

## 2020-01-21 MED ORDER — ONDANSETRON HCL 4 MG/2ML IJ SOLN: 4.0000 mg | Freq: Four times a day (QID) | INTRAMUSCULAR | Status: DC | PRN

## 2020-01-21 MED ORDER — CEFAZOLIN SODIUM-DEXTROSE 2-4 GM/100ML-% IV SOLN
INTRAVENOUS | Status: AC
  Administered 2020-01-21: 2 g via INTRAVENOUS
  Filled 2020-01-21: qty 100

## 2020-01-21 MED ORDER — METOCLOPRAMIDE HCL 10 MG PO TABS: 5.0000 mg | ORAL_TABLET | Freq: Three times a day (TID) | ORAL | Status: DC | PRN

## 2020-01-21 MED ORDER — BUPIVACAINE HCL (PF) 0.5 % IJ SOLN
INTRAMUSCULAR | Status: AC
  Filled 2020-01-21: qty 10

## 2020-01-21 MED ORDER — ACETAMINOPHEN 500 MG PO TABS: 1000.0000 mg | ORAL_TABLET | Freq: Four times a day (QID) | ORAL | Status: DC

## 2020-01-21 MED ORDER — SODIUM CHLORIDE 0.9 % BOLUS PEDS
250.0000 mL | Freq: Once | INTRAVENOUS | Status: AC
  Administered 2020-01-21: 250 mL via INTRAVENOUS

## 2020-01-21 MED ORDER — METOCLOPRAMIDE HCL 5 MG/ML IJ SOLN: 5.0000 mg | Freq: Three times a day (TID) | INTRAMUSCULAR | Status: DC | PRN

## 2020-01-21 MED ORDER — FENTANYL CITRATE (PF) 100 MCG/2ML IJ SOLN
INTRAMUSCULAR | Status: AC
  Administered 2020-01-21: 50 ug via INTRAVENOUS
  Filled 2020-01-21: qty 2

## 2020-01-21 MED ORDER — BUPIVACAINE LIPOSOME 1.3 % IJ SUSP
INTRAMUSCULAR | Status: DC | PRN
  Administered 2020-01-21: 20 mL

## 2020-01-21 MED ORDER — BUPIVACAINE-EPINEPHRINE (PF) 0.5% -1:200000 IJ SOLN
INTRAMUSCULAR | Status: AC
  Filled 2020-01-21: qty 30

## 2020-01-21 MED ORDER — ONDANSETRON HCL 4 MG/2ML IJ SOLN
INTRAMUSCULAR | Status: DC | PRN
  Administered 2020-01-21: 4 mg via INTRAVENOUS

## 2020-01-21 MED ORDER — EPHEDRINE 5 MG/ML INJ
INTRAVENOUS | Status: AC
  Filled 2020-01-21: qty 10

## 2020-01-21 MED ORDER — SODIUM CHLORIDE 0.9 % IV SOLN
INTRAVENOUS | Status: DC | PRN
  Administered 2020-01-21: 10 ug/min via INTRAVENOUS

## 2020-01-21 MED ORDER — FAMOTIDINE 20 MG PO TABS
ORAL_TABLET | ORAL | Status: AC
  Administered 2020-01-21: 20 mg via ORAL
  Filled 2020-01-21: qty 1

## 2020-01-21 MED ORDER — CHLORHEXIDINE GLUCONATE 0.12 % MT SOLN: 15.0000 mL | Freq: Once | OROMUCOSAL | Status: AC

## 2020-01-21 MED ORDER — KETOROLAC TROMETHAMINE 15 MG/ML IJ SOLN
INTRAMUSCULAR | Status: AC
  Filled 2020-01-21: qty 1

## 2020-01-21 MED ORDER — FENTANYL CITRATE (PF) 100 MCG/2ML IJ SOLN
INTRAMUSCULAR | Status: AC
  Filled 2020-01-21: qty 2

## 2020-01-21 MED ORDER — LACTATED RINGERS IV SOLN: INTRAVENOUS | Status: DC | PRN

## 2020-01-21 MED ORDER — KETOROLAC TROMETHAMINE 30 MG/ML IJ SOLN
INTRAMUSCULAR | Status: DC | PRN
  Administered 2020-01-21: 15 mg via INTRAVENOUS

## 2020-01-21 MED ORDER — SODIUM CHLORIDE 0.9 % IV SOLN: INTRAVENOUS | Status: DC

## 2020-01-21 MED ORDER — SUGAMMADEX SODIUM 200 MG/2ML IV SOLN
INTRAVENOUS | Status: DC | PRN
  Administered 2020-01-21: 200 mg via INTRAVENOUS

## 2020-01-21 MED ORDER — ONDANSETRON HCL 4 MG/2ML IJ SOLN: 4.0000 mg | Freq: Once | INTRAMUSCULAR | Status: DC | PRN

## 2020-01-21 MED ORDER — FAMOTIDINE 20 MG PO TABS: 20.0000 mg | ORAL_TABLET | Freq: Once | ORAL | Status: AC

## 2020-01-21 MED ORDER — CEFAZOLIN SODIUM-DEXTROSE 2-4 GM/100ML-% IV SOLN
INTRAVENOUS | Status: AC
  Filled 2020-01-21: qty 100

## 2020-01-21 MED ORDER — LIDOCAINE HCL (PF) 1 % IJ SOLN
INTRAMUSCULAR | Status: AC
  Filled 2020-01-21: qty 5

## 2020-01-21 MED ORDER — MIDAZOLAM HCL 2 MG/2ML IJ SOLN
INTRAMUSCULAR | Status: AC
  Filled 2020-01-21: qty 2

## 2020-01-21 MED ORDER — FENTANYL CITRATE (PF) 100 MCG/2ML IJ SOLN: 50.0000 ug | Freq: Once | INTRAMUSCULAR | Status: AC

## 2020-01-21 MED ORDER — FENTANYL CITRATE (PF) 100 MCG/2ML IJ SOLN: 25.0000 ug | INTRAMUSCULAR | Status: DC | PRN

## 2020-01-21 MED ORDER — ACETAMINOPHEN 10 MG/ML IV SOLN
INTRAVENOUS | Status: DC | PRN
  Administered 2020-01-21: 1000 mg via INTRAVENOUS

## 2020-01-21 MED ORDER — MIDAZOLAM HCL 2 MG/2ML IJ SOLN: 1.0000 mg | Freq: Once | INTRAMUSCULAR | Status: AC

## 2020-01-21 MED ORDER — SODIUM CHLORIDE FLUSH 0.9 % IV SOLN
INTRAVENOUS | Status: AC
  Filled 2020-01-21: qty 40

## 2020-01-21 MED ORDER — MIDAZOLAM HCL 2 MG/2ML IJ SOLN
INTRAMUSCULAR | Status: AC
  Administered 2020-01-21: 1 mg via INTRAVENOUS
  Filled 2020-01-21: qty 2

## 2020-01-21 MED ORDER — LIDOCAINE HCL (CARDIAC) PF 100 MG/5ML IV SOSY
PREFILLED_SYRINGE | INTRAVENOUS | Status: DC | PRN
  Administered 2020-01-21: 80 mg via INTRAVENOUS

## 2020-01-21 MED ORDER — POTASSIUM CHLORIDE IN NACL 20-0.9 MEQ/L-% IV SOLN: INTRAVENOUS | Status: DC

## 2020-01-21 MED ORDER — TRANEXAMIC ACID 1000 MG/10ML IV SOLN
INTRAVENOUS | Status: DC | PRN
  Administered 2020-01-21: 1000 mg via TOPICAL

## 2020-01-21 MED ORDER — OXYCODONE HCL 5 MG PO TABS: 5.0000 mg | ORAL_TABLET | ORAL | 0 refills | Status: DC | PRN

## 2020-01-21 MED ORDER — BUPIVACAINE HCL (PF) 0.5 % IJ SOLN
INTRAMUSCULAR | Status: DC | PRN
  Administered 2020-01-21: 10 mL

## 2020-01-21 MED ORDER — BUPIVACAINE HCL (PF) 0.5 % IJ SOLN
INTRAMUSCULAR | Status: AC
  Filled 2020-01-21: qty 30

## 2020-01-21 MED ORDER — PROPOFOL 10 MG/ML IV BOLUS
INTRAVENOUS | Status: DC | PRN
  Administered 2020-01-21: 150 mg via INTRAVENOUS

## 2020-01-21 MED ORDER — EPHEDRINE SULFATE 50 MG/ML IJ SOLN
INTRAMUSCULAR | Status: DC | PRN
  Administered 2020-01-21: 10 mg via INTRAVENOUS

## 2020-01-21 SURGICAL SUPPLY — 89 items
APL PRP STRL LF DISP 70% ISPRP (MISCELLANEOUS) ×2
BASEPLATE GLENOSPHERE 25 (Plate) ×3 IMPLANT
BASEPLATE GLENOSPHERE 25MM (Plate) ×1 IMPLANT
BEARING HUMERAL 40 STD VITE (Joint) ×4 IMPLANT
BIT DRILL TWIST 2.7 (BIT) ×3 IMPLANT
BIT DRILL TWIST 2.7MM (BIT) ×1
BLADE SAW SAG 25X90X1.19 (BLADE) ×4 IMPLANT
BNDG COHESIVE 4X5 TAN STRL (GAUZE/BANDAGES/DRESSINGS) ×4 IMPLANT
BNDG ELASTIC 2X5.8 VLCR STR LF (GAUZE/BANDAGES/DRESSINGS) ×4 IMPLANT
BNDG ESMARK 4X12 TAN STRL LF (GAUZE/BANDAGES/DRESSINGS) ×4 IMPLANT
BRNG HUM STD 40 RVRS SHLDR (Joint) ×2 IMPLANT
CANISTER SUCT 1200ML W/VALVE (MISCELLANEOUS) ×4 IMPLANT
CANISTER SUCT 3000ML PPV (MISCELLANEOUS) ×8 IMPLANT
CHLORAPREP W/TINT 26 (MISCELLANEOUS) ×4 IMPLANT
COOLER POLAR GLACIER W/PUMP (MISCELLANEOUS) ×4 IMPLANT
CORD BIP STRL DISP 12FT (MISCELLANEOUS) ×4 IMPLANT
COVER BACK TABLE REUSABLE LG (DRAPES) ×4 IMPLANT
COVER WAND RF STERILE (DRAPES) ×4 IMPLANT
CUFF TOURN SGL QUICK 18X4 (TOURNIQUET CUFF) ×4 IMPLANT
DIAL VERSA SHOULDER 40 STD (Joint) ×4 IMPLANT
DRAPE 3/4 80X56 (DRAPES) ×8 IMPLANT
DRAPE IMP U-DRAPE 54X76 (DRAPES) ×8 IMPLANT
DRAPE INCISE IOBAN 66X45 STRL (DRAPES) ×8 IMPLANT
DRAPE SURG 17X11 SM STRL (DRAPES) ×4 IMPLANT
DRSG OPSITE POSTOP 3X4 (GAUZE/BANDAGES/DRESSINGS) ×4 IMPLANT
DRSG OPSITE POSTOP 4X8 (GAUZE/BANDAGES/DRESSINGS) ×4 IMPLANT
ELECT BLADE 6.5 EXT (BLADE) IMPLANT
ELECT CAUTERY BLADE 6.4 (BLADE) ×8 IMPLANT
FORCEPS JEWEL BIP 4-3/4 STR (INSTRUMENTS) ×4 IMPLANT
GAUZE SPONGE 4X4 12PLY STRL (GAUZE/BANDAGES/DRESSINGS) ×4 IMPLANT
GAUZE XEROFORM 1X8 LF (GAUZE/BANDAGES/DRESSINGS) ×4 IMPLANT
GLOVE BIO SURGEON STRL SZ7.5 (GLOVE) ×16 IMPLANT
GLOVE BIO SURGEON STRL SZ8 (GLOVE) ×16 IMPLANT
GLOVE BIOGEL PI IND STRL 8 (GLOVE) ×2 IMPLANT
GLOVE BIOGEL PI INDICATOR 8 (GLOVE) ×2
GLOVE INDICATOR 8.0 STRL GRN (GLOVE) ×4 IMPLANT
GOWN STRL REUS W/ TWL LRG LVL3 (GOWN DISPOSABLE) ×2 IMPLANT
GOWN STRL REUS W/ TWL XL LVL3 (GOWN DISPOSABLE) ×2 IMPLANT
GOWN STRL REUS W/TWL LRG LVL3 (GOWN DISPOSABLE) ×4
GOWN STRL REUS W/TWL XL LVL3 (GOWN DISPOSABLE) ×4
HOOD PEEL AWAY FLYTE STAYCOOL (MISCELLANEOUS) ×12 IMPLANT
ILLUMINATOR WAVEGUIDE N/F (MISCELLANEOUS) IMPLANT
KIT CARPAL TUNNEL (MISCELLANEOUS) ×4
KIT ESCP INSRT D SLOT CANN KN (MISCELLANEOUS) ×2 IMPLANT
KIT STABILIZATION SHOULDER (MISCELLANEOUS) ×4 IMPLANT
KIT TURNOVER KIT A (KITS) ×4 IMPLANT
MASK FACE SPIDER DISP (MASK) ×4 IMPLANT
MAT ABSORB  FLUID 56X50 GRAY (MISCELLANEOUS) ×2
MAT ABSORB FLUID 56X50 GRAY (MISCELLANEOUS) ×2 IMPLANT
NDL SAFETY ECLIPSE 18X1.5 (NEEDLE) ×2 IMPLANT
NEEDLE HYPO 18GX1.5 SHARP (NEEDLE) ×4
NEEDLE HYPO 22GX1.5 SAFETY (NEEDLE) ×4 IMPLANT
NEEDLE SPNL 20GX3.5 QUINCKE YW (NEEDLE) ×4 IMPLANT
NS IRRIG 500ML POUR BTL (IV SOLUTION) ×4 IMPLANT
PACK EXTREMITY (MISCELLANEOUS) ×4 IMPLANT
PACK SHDR ARTHRO (MISCELLANEOUS) ×4 IMPLANT
PAD ARMBOARD 7.5X6 YLW CONV (MISCELLANEOUS) ×4 IMPLANT
PAD WRAPON POLAR SHDR UNIV (MISCELLANEOUS) ×2 IMPLANT
PENCIL SMOKE EVACUATOR (MISCELLANEOUS) ×4 IMPLANT
PULSAVAC PLUS IRRIG FAN TIP (DISPOSABLE) ×4
SCREW BONE LOCKING 4.75X30X3.5 (Screw) ×12 IMPLANT
SCREW BONE STRL 6.5MMX30MM (Screw) ×4 IMPLANT
SCREW LOCKING 4.75MMX15MM (Screw) ×8 IMPLANT
SLING ULTRA II M (MISCELLANEOUS) ×4 IMPLANT
SOL .9 NS 3000ML IRR  AL (IV SOLUTION) ×2
SOL .9 NS 3000ML IRR AL (IV SOLUTION) ×2
SOL .9 NS 3000ML IRR UROMATIC (IV SOLUTION) ×2 IMPLANT
SPLINT WRIST LG LT TX990309 (SOFTGOODS) ×4 IMPLANT
SPLINT WRIST LG RT TX900304 (SOFTGOODS) IMPLANT
SPLINT WRIST M LT TX990308 (SOFTGOODS) IMPLANT
SPLINT WRIST M RT TX990303 (SOFTGOODS) IMPLANT
SPLINT WRIST XL LT TX990310 (SOFTGOODS) IMPLANT
SPLINT WRIST XL RT TX990305 (SOFTGOODS) IMPLANT
SPONGE LAP 18X18 RF (DISPOSABLE) ×4 IMPLANT
STAPLER SKIN PROX 35W (STAPLE) ×4 IMPLANT
STEM HUMERAL STRL 11MMX55MM (Stem) ×4 IMPLANT
STOCKINETTE IMPERVIOUS 9X36 MD (GAUZE/BANDAGES/DRESSINGS) ×4 IMPLANT
SUT ETHIBOND 0 MO6 C/R (SUTURE) ×4 IMPLANT
SUT FIBERWIRE #2 38 BLUE 1/2 (SUTURE) ×16
SUT PROLENE 4 0 PS 2 18 (SUTURE) ×4 IMPLANT
SUT VIC AB 0 CT1 36 (SUTURE) ×4 IMPLANT
SUT VIC AB 2-0 CT1 27 (SUTURE) ×8
SUT VIC AB 2-0 CT1 TAPERPNT 27 (SUTURE) ×4 IMPLANT
SUTURE FIBERWR #2 38 BLUE 1/2 (SUTURE) ×8 IMPLANT
SYR 10ML LL (SYRINGE) ×4 IMPLANT
SYR 30ML LL (SYRINGE) IMPLANT
TIP FAN IRRIG PULSAVAC PLUS (DISPOSABLE) ×2 IMPLANT
TRAY HUM MINI SHOULDER +0 40D (Shoulder) ×4 IMPLANT
WRAPON POLAR PAD SHDR UNIV (MISCELLANEOUS) ×4

## 2020-01-21 NOTE — Transfer of Care (Signed)
Immediate Anesthesia Transfer of Care Note  Patient: Jeremiah Mueller  Procedure(s) Performed: REVERSE SHOULDER ARTHROPLASTY - RNFA (Left Shoulder) CARPAL TUNNEL RELEASE ENDOSCOPIC (Left Wrist)  Patient Location: PACU  Anesthesia Type:GA combined with regional for post-op pain  Level of Consciousness: awake, oriented, drowsy and patient cooperative  Airway & Oxygen Therapy: Patient Spontanous Breathing  Post-op Assessment: Report given to RN and Post -op Vital signs reviewed and stable  Post vital signs: Reviewed and stable  Last Vitals:  Vitals Value Taken Time  BP 135/84 01/21/20 1437  Temp 36.1 C 01/21/20 1437  Pulse 52 01/21/20 1443  Resp 15 01/21/20 1443  SpO2 100 % 01/21/20 1443  Vitals shown include unvalidated device data.  Last Pain:  Vitals:   01/21/20 1437  TempSrc:   PainSc: 0-No pain         Complications: No complications documented.

## 2020-01-21 NOTE — Anesthesia Preprocedure Evaluation (Addendum)
Anesthesia Evaluation  Patient identified by MRN, date of birth, ID band Patient awake    Reviewed: Allergy & Precautions, NPO status , Patient's Chart, lab work & pertinent test results  Airway Mallampati: II  TM Distance: >3 FB     Dental   Pulmonary Current Smoker,    Pulmonary exam normal        Cardiovascular negative cardio ROS Normal cardiovascular exam     Neuro/Psych negative neurological ROS  negative psych ROS   GI/Hepatic negative GI ROS, Neg liver ROS,   Endo/Other  diabetes  Renal/GU negative Renal ROS  negative genitourinary   Musculoskeletal  (+) Arthritis , Osteoarthritis,    Abdominal Normal abdominal exam  (+)   Peds negative pediatric ROS (+)  Hematology negative hematology ROS (+)   Anesthesia Other Findings   Reproductive/Obstetrics                             Anesthesia Physical Anesthesia Plan  ASA: II  Anesthesia Plan: General   Post-op Pain Management:  Regional for Post-op pain   Induction:   PONV Risk Score and Plan:   Airway Management Planned: Oral ETT  Additional Equipment:   Intra-op Plan:   Post-operative Plan: Extubation in OR  Informed Consent: I have reviewed the patients History and Physical, chart, labs and discussed the procedure including the risks, benefits and alternatives for the proposed anesthesia with the patient or authorized representative who has indicated his/her understanding and acceptance.     Dental advisory given  Plan Discussed with: CRNA and Surgeon  Anesthesia Plan Comments: (GOT and interscalene block discussed with patient for postop pain control.  Risks of interscalene block were discussed which included pneumothorax, nerve damage, infection, cardiac arrest, seizures and block not working well.  Patient understands risks and agrees to block for postop pain control.)       Anesthesia Quick Evaluation

## 2020-01-21 NOTE — Discharge Instructions (Addendum)
Orthopedic discharge instructions: Keep wrist dressing dry and intact.  May shower after wrist dressing changed on post-op day #4 (Saturday). Leave op-site dressing on shoulder intact. Cover sutures with Band-Aids after drying off. Apply ice frequently to wrist.  Use Polar Care or apply ice to shoulder. Take ibuprofen 600-800 mg TID OR Aleve 2 tabs BID with meals for 7-10 days, then as necessary if can tolerate this medication. Take oxycodone as prescribed when needed.  May supplement with ES Tylenol if necessary. Keep shoulder immobilizer and Velcro wrist splint on at all times except may remove for bathing purposes. Follow-up in 10-14 days or as scheduled.  TID = three times a day BID = two times a day   AMBULATORY SURGERY  DISCHARGE INSTRUCTIONS   1) The drugs that you were given will stay in your system until tomorrow so for the next 24 hours you should not:  A) Drive an automobile B) Make any legal decisions C) Drink any alcoholic beverage   2) You may resume regular meals tomorrow.  Today it is better to start with liquids and gradually work up to solid foods.  You may eat anything you prefer, but it is better to start with liquids, then soup and crackers, and gradually work up to solid foods.   3) Please notify your doctor immediately if you have any unusual bleeding, trouble breathing, redness and pain at the surgery site, drainage, fever, or pain not relieved by medication.    4) Additional Instructions:        Please contact your physician with any problems or Same Day Surgery at [phone], Monday through Friday 6 am to 4 pm, or [place] at [facility] number at [phone].   Interscalene Nerve Block, Care After This sheet gives you information about how to care for yourself after your procedure. Your health care provider may also give you more specific instructions. If you have problems or questions, contact your health care provider. What can I  expect after the procedure? After the procedure, it is common to have:  Soreness or tenderness in your neck.  Numbness in your shoulder, upper arm, and some fingers.  Weakness in your shoulder and arm muscles. The feeling and strength in your shoulder, arm, and fingers should return to normal within hours after your procedure. Follow these instructions at home: For at least 24 hours after the procedure:  Do not: ? Participate in activities in which you could fall or become injured. ? Drive. ? Use heavy machinery. ? Drink alcohol. ? Take sleeping pills or medicines that cause drowsiness. ? Make important decisions or sign legal documents. ? Take care of children on your own.  Rest. Eating and drinking  If you vomit, drink water, juice, or soup when you can drink without vomiting.  Make sure you have little or no nausea before eating solid foods.  Follow the diet that is recommended by your health care provider. If you have a sling:  Wear it as told by your health care provider. Remove it only as told by your health care provider.  Loosen the sling if your fingers tingle, become numb, or turn cold and blue.  Make sure that your entire arm, including your wrist, is supported. Do not allow your wrist to dangle over the end of the sling.  Do not let your sling get wet if it is not waterproof.  Keep the sling clean. Bathing  Do not take baths, swim, or use a hot tub until  your health care provider approves.  If you have a nerve block catheter in place, keep the incision site and tubing dry. Injection site care   Wash your hands with soap and water before you change your bandage (dressing). If soap and water are not available, use hand sanitizer.  Change your dressing as told by your health care provider.  Keep your dressing dry.  Check your nerve block injection site every day for signs of infection. Check for: ? Redness, swelling, or pain. ? Fluid or  blood. ? Warmth. Activity  Do not perform complex or risky activities while taking prescription pain medicine and until you have fully recovered.  Return to your normal activities as told by your health care provider and as you can tolerate them. Ask your health care provider what activities are safe for you.  Rest and take it easy. This will help you heal and recover more quickly and fully.  Be very cautious until you have regained strength and sensation. General instructions  Have a responsible adult stay with you until you are awake and alert.  Do not drive or use heavy machinery while taking prescription pain medicine and until you have fully recovered. Ask your health care provider when it is safe to drive.  Take over-the-counter and prescription medicines only as told by your health care provider.  If you smoke, do not smoke without supervision.  Do not expose your arm or shoulder to very cold or very hot temperatures until you have full feeling back.  If you have a nerve block catheter in place: ? Try to keep the catheter from getting kinked or pinched. ? Avoid pulling or tugging on the catheter.  Keep all follow-up visits as told by your health care provider. This is important. Contact a health care provider if:  You have chills or fever.  You have redness, swelling, or pain around your injection site.  You have fluid or blood coming from the injection site.  The skin around the injection site is warm to the touch.  There is a bad smell coming from your dressing.  You have hoarseness or a drooping or dry eye that lasts more than a few days.  You have pain that is poorly controlled with the block or with pain medicine.  You have numbness, tingling, or weakness in your shoulder or arm that lasts for more than one week. Get help right away if:  You have severe pain.  You lose or do not regain strength and sensation in your arm even after the nerve block medicine  has stopped.  You have trouble breathing.  You have a nerve block catheter still in place and you begin to shiver.  You have a nerve block catheter still in place and you are getting more and more numb or weak. This information is not intended to replace advice given to you by your health care provider. Make sure you discuss any questions you have with your health care provider. Document Revised: 05/05/2017 Document Reviewed: 01/01/2016 Elsevier Patient Education  2020 [employer].

## 2020-01-21 NOTE — H&P (Signed)
History of Present Illness:  Jeremiah Mueller is a 64 y.o. 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 The patient was last seen for these symptoms 6.5 months ago. At this visit, he was given a steroid injection into the left shoulder which he states provided temporary partial relief of his symptoms lasting a month or so before his symptoms began to recur. He notes that his symptoms are gradually worsened over the past few months, especially in regards to his left shoulder symptoms. He rates his left shoulder pain at 7/10 on today's visit. He has not been taking any medications for discomfort, but continues to try to work without any restrictions. 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 He is having pain at night, as well as with any activities at or above shoulder level or when reaching behind his back. He is quite frustrated by his symptoms and functional limitations, and is ready to consider more aggressive treatment options.  Current Outpatient Medications: . blood glucose diagnostic (GLUCOSE BLOOD) test strip 1 each (1 strip total) by XX route once daily Use as instructed. 100 each 12  . blood glucose meter kit by XX route as directed 1 each 0  . gabapentin (NEURONTIN) 300 MG capsule Take 1 capsule (300 mg total) by mouth nightly 60 capsule 0  . lancing device with lancets kit Use 1 each once daily Use as instructed. 100 each 12  . metFORMIN (GLUCOPHAGE) 1000 MG tablet Take 1 tablet (1,000 mg total) by mouth 2 (two) times daily with meals for 180 days 60 tablet 5   No current Epic-ordered facility-administered medications on file.   Allergies: No Known Allergies  Past Medical History:  . DDD (degenerative disc disease), lumbar 05/12/2016  MRI 11/16 with degenerative disc disease in the lumbar spine,  with moderate to severe foraminal stenosis on the right at L5-S1  . Diabetes (CMS-HCC)   Past Surgical History:  . APPENDECTOMY  . COLONOSCOPY 03/15/2013  Dr. Marland Kitchen @ [facility] - Adenomatous Polyp, PH Polyps, Poor Prep and rpt immediate per [place]  . COLONOSCOPY 11/02/2017  Dr. Clance Boll @ [facility] - Adenomatous Polyp, rpt 3-5 yrs per provider   Family History:  . Kidney disease Mother  . Kidney disease Father   Social History:   Socioeconomic History:  Marland Kitchen Marital status: Married  Spouse name: Not on file  . Number of children: Not on file  . Years of education: Not on file  . Highest education level: Not on file  Occupational History  . Not on file  Tobacco Use  . Smoking status: Current Every Day Smoker  Types: Cigars  . Smokeless tobacco: Never Used  Substance and Sexual Activity  . Alcohol use: Yes  Alcohol/week: 5.0 standard drinks  Types: 5 Shots of liquor per week  . Drug use: No  . Sexual activity: Defer  Other Topics Concern  . Not on file  Social History Narrative  . Not on file   Social Determinants of Health   Financial Resource Strain:  . Difficulty of Paying Living Expenses:  Food Insecurity:  . Worried About Charity fundraiser in the Last Year:  . Arboriculturist in the Last Year:  Transportation Needs:  . Film/video editor (Medical):  Marland Kitchen Lack of Transportation (Non-Medical):   Review of  Systems:  A comprehensive 14 point ROS was performed, reviewed, and the pertinent orthopaedic findings are documented in the HPI.  Physical Exam: Vitals:  11/08/19 1155  BP: 118/80  Weight: 58.5 kg (129 lb)  Height: 170.2 cm ($RemoveB'5\' 7"'GhaTysRG$ )  PainSc: 7  PainLoc: Shoulder   General/Constitutional: The patient appears to be well-nourished, well-developed, and in no acute distress. Neuro/Psych: Normal mood and affect, oriented to person, place and time. Eyes: Non-icteric. Pupils are equal, round, and reactive to light, and exhibit synchronous movement. ENT:  Unremarkable. Lymphatic: No palpable adenopathy. Respiratory: Lungs clear to auscultation, Normal chest excursion, No wheezes and Non-labored breathing Cardiovascular: Regular rate and rhythm. No murmurs. and No edema, swelling or tenderness, except as noted in detailed exam. Integumentary: No impressive skin lesions present, except as noted in detailed exam. Musculoskeletal: Unremarkable, except as noted in detailed exam.  Leftshoulder exam: SKIN: Mild Popeye deformity anteriorly, otherwise unremarkable SWELLING:None WARMTH:None LYMPH NODES: No adenopathy palpable CREPITUS:None TENDERNESS:Mild-moderatelytender along the anterolateral acromion ROM (active):  Forward flexion: 80 degrees Abduction: 75 degrees Internal rotation: Left iliac crest ROM (passive):  Forward flexion: 145 degrees Abduction: 140degrees ER/IR at 90 abd: 75 degrees/40 degrees  He experiences at least moderate pain at the extremes of all motions.  STRENGTH: Forward flexion: 3/5 Abduction: 3/5 External rotation: 4/5 Internal rotation: 4-4+/5 Pain with RC testing: Moderate pain with resisted forward flexion and abduction more so than with resisted external rotation.  STABILITY:Normal  SPECIAL TESTS: Luan Pulling' test: Moderately positive Speed's test: Mildly positive Capsulitis - pain w/ passive ER: No Crossed arm test: Mildly positive Crank: Not evaluated Anterior apprehension: Negative Posterior apprehension: Not evaluated  He is neurovascularly intact to theleft upper extremity and hand.  Assessment: . Nontraumatic complete tear of left rotator cuff  .  Glenohumeral arthritis, left  . Rotator cuff tendinitis, left   Plan: The treatment options were discussed with the patient and his wife. In addition, patient educational materials were provided regarding the diagnosis and treatment options. The patient is quite frustrated by his symptoms and function limitations, and is ready to consider more aggressive treatment options. Therefore, I have recommended a surgical procedure, specifically a reverse left total shoulder arthroplasty. The procedure was discussed with the patient, as were the potential risks (including bleeding, infection, nerve and/or blood vessel injury, persistent or recurrent pain, loosening and/or failure of the components, dislocation, leg length inequality, need for further surgery, blood clots, strokes, heart attacks and/or arhythmias, pneumonia, etc.) and benefits. The patient states his/her understanding and wishes to proceed. All of the patient's questions and concerns were answered. He can call any time with further concerns. He will follow up post-surgery, routine.   H&P reviewed and patient re-examined. No changes.

## 2020-01-21 NOTE — Anesthesia Procedure Notes (Signed)
Procedure Name: Intubation Date/Time: 01/21/2020 10:06 AM Performed by: Debe Coder, CRNA Pre-anesthesia Checklist: Patient identified, Emergency Drugs available, Suction available and Patient being monitored Patient Re-evaluated:Patient Re-evaluated prior to induction Oxygen Delivery Method: Circle system utilized Preoxygenation: Pre-oxygenation with 100% oxygen Induction Type: IV induction Ventilation: Mask ventilation without difficulty Laryngoscope Size: Mac and 3 Grade View: Grade I Tube type: Oral Tube size: 7.5 mm Number of attempts: 1 Airway Equipment and Method: Stylet and Oral airway Placement Confirmation: ETT inserted through vocal cords under direct vision,  positive ETCO2 and breath sounds checked- equal and bilateral Secured at: 21 cm Tube secured with: Tape Dental Injury: Teeth and Oropharynx as per pre-operative assessment

## 2020-01-21 NOTE — Anesthesia Procedure Notes (Signed)
Anesthesia Regional Block: Interscalene brachial plexus block   Pre-Anesthetic Checklist: ,, timeout performed, Correct Patient, Correct Site, Correct Laterality, Correct Procedure, Correct Position, site marked, Risks and benefits discussed,  Surgical consent,  Pre-op evaluation,  At surgeon's request and post-op pain management  Laterality: Left  Prep: chloraprep, alcohol swabs       Needles:  Injection technique: Single-shot  Needle Type: Stimiplex     Needle Length: 5cm  Needle Gauge: 22     Additional Needles:   Procedures:, nerve stimulator,,, ultrasound used (permanent image in chart),,,,   Nerve Stimulator or Paresthesia:  Response: biceps flexion, 0.7 mA,   Additional Responses:   Narrative:  Start time: 01/21/2020 9:25 AM End time: 01/21/2020 9:35 AM Injection made incrementally with aspirations every 5 mL.  Performed by: Personally  Anesthesiologist: Alvin Critchley, MD  Additional Notes: Functioning IV was confirmed and monitors were applied.  A 97mm 22ga Stimuplex needle was used. Sterile prep and drape,hand hygiene and sterile gloves were used.  Negative aspiration and negative test dose prior to incremental administration of local anesthetic. The patient tolerated the procedure well.  Easy injection with no pain on injection.

## 2020-01-21 NOTE — Op Note (Addendum)
01/21/2020  1:58 PM  Patient:   Jeremiah Mueller  Pre-Op Diagnosis:   1.  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 2.  Left carpal tunnel syndrome,.  Post-Op Diagnosis:   Same  Procedure:   1.  Reverse left total shoulder arthroplasty.  2.  Endoscopic left carpal tunnel release.  Surgeon:   Pascal Lux, MD  Assistant:   Cameron Proud, PA-C  Anesthesia:   General endotracheal with an interscalene block using Exparel placed preoperatively by the anesthesiologist.  Findings:   As above.  Complications:   None  EBL:    150 cc  Fluids:   700 cc crystalloid  UOP:   None  TT:   None  Drains:   None  Closure:   Staples for the shoulder and 4-0 Prolene interrupted sutures for the wrist.  Implants:   All press-fit Biomet Comprehensive system with a #11 micro-humeral stem, a 40 mm humeral tray with a standard insert, and a mini-base plate with a 40 mm glenosphere.  Brief Clinical Note:   The patient is a 64 year old male with a long history of progressively worsening left shoulder pain. His symptoms have progressed despite medications, activity modification, etc. His history and examination are consistent with a massive irreparable rotator cuff tear with early cuff arthropathy, all of which were confirmed by MRI scan. The patient presents at this time for a reverse left total shoulder arthroplasty.  The patient also notes a long history of gradually worsening pain and paresthesias to his left hand. His symptoms have progressed despite medications, activity modification, etc. His history and examination are consistent with carpal tunnel syndrome confirmed by EMG. The patient presents at this time for an endoscopic left carpal tunnel release as well.  Procedure:   The patient underwent placement of an interscalene block using Exparel by the anesthesiologist in the preoperative holding area before being brought into the operating room and  lain in the supine position. The patient underwent satisfactory general endotracheal intubation and anesthesia before the left hand and upper extremity were prepped with DuraPrep solution and draped sterilely. Preoperative antibiotics were administered. A timeout was performed to verify the appropriate surgical site before the limb was exsanguinated with an Esmarch and the tourniquet inflated to 250 mmHg.   An approximately 1.5-2 cm incision was made over the volar wrist flexion crease, centered over the palmaris longus tendon. The incision was carried down through the subcutaneous tissues with care taken to identify and protect any neurovascular structures. The distal forearm fascia was penetrated just proximal to the transverse carpal ligament. The soft tissues were released off the superficial and deep surfaces of the distal forearm fascia and this was released proximally for 3-4 cm under direct visualization.  Attention was directed distally. The Soil scientist was passed beneath the transverse carpal ligament along the ulnar aspect of the carpal tunnel and used to release any adhesions as well as to remove any adherent synovial tissue before first the smaller then the larger of the two dilators were passed beneath the transverse carpal ligament along the ulnar margin of the carpal tunnel. The slotted cannula was introduced and the endoscope was placed into the slotted cannula and the undersurface of the transverse carpal ligament visualized. The distal margin of the transverse carpal ligament was marked by placing a 25-gauge needle percutaneously at [place] cardinal point so that it entered the distal portion of the slotted cannula. Under endoscopic visualization, the transverse carpal ligament was  released from proximal to distal using the end-cutting blade. A second pass was performed to ensure complete release of the ligament. The adequacy of release was verified both endoscopically and by palpation  using the freer elevator.  The wound was irrigated thoroughly with sterile saline solution before being closed using 4-0 Prolene interrupted sutures. A total of 10 cc of 0.5% plain Sensorcaine was injected in and around the incision before a sterile bulky dressing was applied to the wound.   Next, the left shoulder was addressed. The patient was repositioned in the beach chair position using the beach chair positioner. The left shoulder and upper extremity were prepped with ChloraPrep solution before being draped sterilely. A second timeout was performed to verify the appropriate surgical site before a standard anterior approach to the shoulder was made through an approximately 4-5 inch incision. The incision was carried down through the subcutaneous tissues to expose the deltopectoral fascia. The interval between the deltoid and pectoralis muscles was identified and this plane developed, retracting the cephalic vein laterally with the deltoid muscle. The conjoined tendon was identified. Its lateral margin was dissected and the Kolbel self-retraining retractor inserted. The "three sisters" were identified and cauterized. Bursal tissues were removed to improve visualization. The subscapularis tendon was released from its attachment to the lesser tuberosity 1 cm proximal to its insertion and several tagging sutures placed. The inferior capsule was released with care after identifying and protecting the axillary nerve. The proximal humeral cut was made at approximately 25 of retroversion using the extra-medullary guide.   Attention was redirected to the glenoid. The labrum was debrided circumferentially before the center of the glenoid was marked with electrocautery. The guidewire was drilled into the glenoid neck using the appropriate guide. After verifying its position, it was overreamed with the mini-baseplate reamer to create a flat surface. The permanent mini-baseplate was impacted into place. It was  stabilized with a 30 x 6.5 mm central screw and four peripheral locking screws. The permanent 40 mm glenosphere was then impacted into place and its Morse taper locking mechanism verified using manual distraction.  Attention was directed to the humeral side. The humeral canal was reamed sequentially beginning with the end-cutting reamer then progressing from a 4 mm reamer up to an 11 mm reamer. This provided excellent circumferential chatter. The canal was broached beginning with a #9 broach and progressing to a #11 broach. This was left in place and a trial reduction performed using the standard trial humeral platform. The arm demonstrated excellent range of motion as the hand could be brought across the chest to the opposite shoulder and brought to the top of the patient's head and to the patient's ear. The shoulder appeared stable throughout this range of motion. The joint was dislocated and the trial components removed. The permanent #11 micro-stem was impacted into place with care taken to maintain the appropriate version. The permanent 40 mm humeral platform with the standard insert was put together on the back table and impacted into place. Again, the [facility] taper locking mechanism was verified using manual distraction. The shoulder was relocated using two finger pressure and again placed through a range of motion with the findings as described above.  The wound was copiously irrigated with sterile saline solution using the jet lavage system before a total of 30 cc of 0.5% Sensorcaine with epinephrine was injected into the pericapsular and peri-incisional tissues to help with postoperative analgesia. The subscapularis tendon was reapproximated using #2 FiberWire interrupted sutures. The deltopectoral  interval was closed using #0 Vicryl interrupted sutures before the subcutaneous tissues were closed using 2-0 Vicryl interrupted sutures. The skin was closed using staples. Prior to closing the skin, 1 g of  transexemic acid in 10 cc of normal saline was injected intra-articularly to help with postoperative bleeding. A sterile occlusive dressing was applied to the wound before the arm was placed into a shoulder immobilizer with an abduction pillow. A Polar Care system also was applied to the shoulder. The left wrist was placed into a Velcro wrist immobilizer at this point. The patient then was transferred back to a hospital bed before being awakened, extubated, and returned to the recovery room in satisfactory condition after tolerating the procedure well.

## 2020-01-22 ENCOUNTER — Encounter: Payer: Self-pay | Admitting: Surgery

## 2020-01-23 LAB — SURGICAL PATHOLOGY

## 2020-01-28 NOTE — Anesthesia Postprocedure Evaluation (Signed)
Anesthesia Post Note  Patient: Jeremiah Mueller  Procedure(s) Performed: REVERSE SHOULDER ARTHROPLASTY - RNFA (Left Shoulder) CARPAL TUNNEL RELEASE ENDOSCOPIC (Left Wrist)  Patient location during evaluation: PACU Anesthesia Type: Regional and General Level of consciousness: awake and alert and oriented Pain management: pain level controlled Vital Signs Assessment: post-procedure vital signs reviewed and stable Respiratory status: spontaneous breathing Cardiovascular status: blood pressure returned to baseline Anesthetic complications: no   No complications documented.   Last Vitals:  Vitals:   01/21/20 1453 01/21/20 1559  BP: (!) 141/83 133/80  Pulse: (!) 53 (!) 51  Resp: 16 16  Temp: (!) 36.1 C   SpO2: 100% 100%    Last Pain:  Vitals:   01/22/20 1334  TempSrc:   PainSc: 3                  Nykiah Ma

## 2020-04-24 ENCOUNTER — Other Ambulatory Visit: Payer: Self-pay | Admitting: Surgery

## 2020-05-05 ENCOUNTER — Other Ambulatory Visit: Payer: Self-pay

## 2020-05-05 ENCOUNTER — Encounter
Admission: RE | Admit: 2020-05-05 | Discharge: 2020-05-05 | Disposition: A | Discharge status: Home / Self Care | Payer: BC Managed Care – PPO | Source: Ambulatory Visit | Attending: Surgery | Admitting: Surgery

## 2020-05-05 DIAGNOSIS — Z01812 Encounter for preprocedural laboratory examination: Secondary | ICD-10-CM | POA: Diagnosis present

## 2020-05-05 HISTORY — DX: Carpal tunnel syndrome, unspecified upper limb: G56.00

## 2020-05-05 LAB — URINALYSIS, ROUTINE W REFLEX MICROSCOPIC
Bacteria, UA: NONE SEEN
Bilirubin Urine: NEGATIVE
Glucose, UA: >=500 mg/dL — AB
Hgb urine dipstick: NEGATIVE
Ketones, ur: NEGATIVE mg/dL
Leukocytes,Ua: NEGATIVE
Nitrite: NEGATIVE
Protein, ur: NEGATIVE mg/dL
Specific Gravity, Urine: 1.01 (ref 1.005–1.030)
Squamous Epithelial / HPF: NONE SEEN (ref 0–5)
pH: 5 (ref 5.0–8.0)

## 2020-05-05 LAB — CBC WITH DIFFERENTIAL/PLATELET
Abs Immature Granulocytes: 0.05 10*3/uL (ref 0.00–0.07)
Basophils Absolute: 0.1 10*3/uL (ref 0.0–0.1)
Basophils Relative: 1 %
Eosinophils Absolute: 0.3 10*3/uL (ref 0.0–0.5)
Eosinophils Relative: 4 %
HCT: 42.7 % (ref 39.0–52.0)
Hemoglobin: 14.7 g/dL (ref 13.0–17.0)
Immature Granulocytes: 1 %
Lymphocytes Relative: 38 %
Lymphs Abs: 3.2 10*3/uL (ref 0.7–4.0)
MCH: 29.6 pg (ref 26.0–34.0)
MCHC: 34.4 g/dL (ref 30.0–36.0)
MCV: 85.9 fL (ref 80.0–100.0)
Monocytes Absolute: 0.8 10*3/uL (ref 0.1–1.0)
Monocytes Relative: 10 %
Neutro Abs: 3.9 10*3/uL (ref 1.7–7.7)
Neutrophils Relative %: 46 %
Platelets: 288 10*3/uL (ref 150–400)
RBC: 4.97 MIL/uL (ref 4.22–5.81)
RDW: 14.1 % (ref 11.5–15.5)
WBC: 8.3 10*3/uL (ref 4.0–10.5)
nRBC: 0 % (ref 0.0–0.2)

## 2020-05-05 LAB — COMPREHENSIVE METABOLIC PANEL
ALT: 20 U/L (ref 0–44)
AST: 22 U/L (ref 15–41)
Albumin: 4.5 g/dL (ref 3.5–5.0)
Alkaline Phosphatase: 58 U/L (ref 38–126)
Anion gap: 12 (ref 5–15)
BUN: 16 mg/dL (ref 8–23)
CO2: 24 mmol/L (ref 22–32)
Calcium: 9.9 mg/dL (ref 8.9–10.3)
Chloride: 101 mmol/L (ref 98–111)
Creatinine, Ser: 0.64 mg/dL (ref 0.61–1.24)
GFR, Estimated: >60 mL/min (ref >60)
Glucose, Bld: 225 mg/dL — ABNORMAL HIGH (ref 70–99)
Potassium: 4.1 mmol/L (ref 3.5–5.1)
Sodium: 137 mmol/L (ref 135–145)
Total Bilirubin: 1.1 mg/dL (ref 0.3–1.2)
Total Protein: 7.4 g/dL (ref 6.5–8.1)

## 2020-05-05 LAB — SURGICAL PCR SCREEN
MRSA, PCR: NEGATIVE
Staphylococcus aureus: NEGATIVE

## 2020-05-05 LAB — TYPE AND SCREEN
ABO/RH(D): A POS
Antibody Screen: NEGATIVE

## 2020-05-05 NOTE — Patient Instructions (Signed)
Your procedure is scheduled on: Tues. 12/28 Report to Registration   Then to 2nd floor To find out your arrival time please call [phone] between 1PM - 3PM on Mon 12/27.  Remember: Instructions that are not followed completely may result in serious medical risk,  up to and including death, or upon the discretion of your surgeon and anesthesiologist your  surgery may need to be rescheduled.     _X__ 1. Do not eat food after midnight the night before your procedure.                 No chewing gum or hard candies. You may drink clear liquids up to 2 hours                 before you are scheduled to arrive for your surgery- DO not drink clear                 liquids within 2 hours of the start of your surgery.                 Clear Liquids include:  water, apple juice without pulp, clear Gatorade, G2 or                  Gatorade Zero , Black Coffee or Tea (Do not add                 anything to coffee or tea). ___x__2.   If you  are diabetic drink gatorade zero or Gatorade 2  8-10 oz 2 hours before arrival  __X__2.  On the morning of surgery brush your teeth with toothpaste and water, you                may rinse your mouth with mouthwash if you wish.  Do not swallow any toothpaste of mouthwash.     ___ 3.  No Alcohol for 24 hours before or after surgery.   _X__ 4.  Do Not Smoke or use e-cigarettes For 24 Hours Prior to Your Surgery.                 Do not use any chewable tobacco products for at least 6 hours prior to                 Surgery.  ___  5.  Do not use any recreational drugs (marijuana, cocaine, heroin, ecstasy, MDMA or other)                For at least one week prior to your surgery.  Combination of these drugs with anesthesia                May have life threatening results.  ____  6.  Bring all medications with you on the day of surgery if instructed.   __x__  7.  Notify your doctor if there is any change in your medical condition      (cold,  fever, infections).     Do not wear jewelry,  Do not wear lotions,  Do not shave 48 hours prior to surgery. Men may shave face and neck. Do not bring valuables to the hospital.    [facility] is not responsible for any belongings or valuables.  Contacts, dentures or bridgework may not be worn into surgery. Leave your suitcase in the car. After surgery it may be brought to your room. For patients admitted to the hospital, discharge time is determined by your treatment  team.   Patients discharged the day of surgery will not be allowed to drive home.   Make arrangements for someone to be with you for the first 24 hours of your Same Day Discharge.    Please read over the following fact sheets that you were given:   Incentive spirometer    __x__ Take these medicines the morning of surgery with A SIP OF WATER:    1. rosuvastatin (CRESTOR) 20 MG tablet  2.   3.   4.  5.  6.  ____ Fleet Enema (as directed)   _x___ Use CHG Soap (or wipes) as directed  __x__ Use Benzoyl Peroxide Gel as instructed  ____ Use inhalers on the day of surgery  __x__ Stop metformin 2 days prior to surgery  Last dose on 12/25    ____ Take 1/2 of usual insulin dose the night before surgery. No insulin the morning          of surgery.   ____ Stop Coumadin/Plavix/aspirin on   ____ Stop Anti-inflammatories on    ____ Stop supplements until after surgery.    ____ Bring C-Pap to the hospital.    If you have any questions regarding your pre-procedure instructions,  Please call Pre-admit Testing at [phone]

## 2020-05-08 ENCOUNTER — Other Ambulatory Visit: Payer: Self-pay

## 2020-05-08 ENCOUNTER — Other Ambulatory Visit
Admission: RE | Admit: 2020-05-08 | Discharge: 2020-05-08 | Disposition: A | Discharge status: Home / Self Care | Payer: BC Managed Care – PPO | Source: Ambulatory Visit | Attending: Surgery | Admitting: Surgery

## 2020-05-08 DIAGNOSIS — Z01812 Encounter for preprocedural laboratory examination: Secondary | ICD-10-CM | POA: Diagnosis present

## 2020-05-08 DIAGNOSIS — Z20822 Contact with and (suspected) exposure to covid-19: Secondary | ICD-10-CM | POA: Insufficient documentation

## 2020-05-08 LAB — SARS CORONAVIRUS 2 (TAT 6-24 HRS): SARS Coronavirus 2: NEGATIVE

## 2020-05-12 ENCOUNTER — Encounter
Admission: RE | Disposition: A | Discharge status: Home / Self Care | Payer: Self-pay | Source: Home / Self Care | Attending: Surgery

## 2020-05-12 ENCOUNTER — Inpatient Hospital Stay: Payer: BC Managed Care – PPO

## 2020-05-12 ENCOUNTER — Other Ambulatory Visit: Payer: Self-pay

## 2020-05-12 ENCOUNTER — Ambulatory Visit
Admission: RE | Admit: 2020-05-12 | Discharge: 2020-05-12 | Disposition: A | Discharge status: Home / Self Care | Payer: BC Managed Care – PPO | Attending: Surgery | Admitting: Surgery

## 2020-05-12 ENCOUNTER — Encounter: Payer: Self-pay | Admitting: Surgery

## 2020-05-12 ENCOUNTER — Inpatient Hospital Stay: Payer: BC Managed Care – PPO | Admitting: Anesthesiology

## 2020-05-12 DIAGNOSIS — Z96612 Presence of left artificial shoulder joint: Secondary | ICD-10-CM | POA: Insufficient documentation

## 2020-05-12 DIAGNOSIS — F17290 Nicotine dependence, other tobacco product, uncomplicated: Secondary | ICD-10-CM | POA: Diagnosis not present

## 2020-05-12 DIAGNOSIS — Z7984 Long term (current) use of oral hypoglycemic drugs: Secondary | ICD-10-CM | POA: Diagnosis not present

## 2020-05-12 DIAGNOSIS — G5601 Carpal tunnel syndrome, right upper limb: Secondary | ICD-10-CM | POA: Diagnosis not present

## 2020-05-12 DIAGNOSIS — Z96611 Presence of right artificial shoulder joint: Secondary | ICD-10-CM

## 2020-05-12 DIAGNOSIS — M25511 Pain in right shoulder: Secondary | ICD-10-CM

## 2020-05-12 DIAGNOSIS — Z79899 Other long term (current) drug therapy: Secondary | ICD-10-CM | POA: Diagnosis not present

## 2020-05-12 DIAGNOSIS — M19011 Primary osteoarthritis, right shoulder: Secondary | ICD-10-CM | POA: Insufficient documentation

## 2020-05-12 HISTORY — PX: CARPAL TUNNEL RELEASE: SHX101

## 2020-05-12 HISTORY — PX: REVERSE SHOULDER ARTHROPLASTY: SHX5054

## 2020-05-12 LAB — GLUCOSE, CAPILLARY
Glucose-Capillary: 117 mg/dL — ABNORMAL HIGH (ref 70–99)
Glucose-Capillary: 183 mg/dL — ABNORMAL HIGH (ref 70–99)

## 2020-05-12 SURGERY — ARTHROPLASTY, SHOULDER, TOTAL, REVERSE
Anesthesia: General | Site: Shoulder | Laterality: Right

## 2020-05-12 MED ORDER — BUPIVACAINE HCL (PF) 0.5 % IJ SOLN
INTRAMUSCULAR | Status: AC
  Filled 2020-05-12: qty 30

## 2020-05-12 MED ORDER — FENTANYL CITRATE (PF) 100 MCG/2ML IJ SOLN: 25.0000 ug | INTRAMUSCULAR | Status: DC | PRN

## 2020-05-12 MED ORDER — HYDROMORPHONE HCL 2 MG PO TABS: 2.0000 mg | ORAL_TABLET | ORAL | 0 refills | Status: DC | PRN

## 2020-05-12 MED ORDER — PROPOFOL 10 MG/ML IV BOLUS
INTRAVENOUS | Status: AC
  Filled 2020-05-12: qty 20

## 2020-05-12 MED ORDER — KETOROLAC TROMETHAMINE 15 MG/ML IJ SOLN: 15.0000 mg | Freq: Four times a day (QID) | INTRAMUSCULAR | Status: DC

## 2020-05-12 MED ORDER — EPHEDRINE SULFATE 50 MG/ML IJ SOLN
INTRAMUSCULAR | Status: DC | PRN
  Administered 2020-05-12: 10 mg via INTRAVENOUS

## 2020-05-12 MED ORDER — ONDANSETRON HCL 4 MG/2ML IJ SOLN
INTRAMUSCULAR | Status: DC | PRN
  Administered 2020-05-12: 4 mg via INTRAVENOUS

## 2020-05-12 MED ORDER — ONDANSETRON HCL 4 MG PO TABS: 4.0000 mg | ORAL_TABLET | Freq: Four times a day (QID) | ORAL | Status: DC | PRN

## 2020-05-12 MED ORDER — KETOROLAC TROMETHAMINE 30 MG/ML IJ SOLN: 30.0000 mg | Freq: Once | INTRAMUSCULAR | Status: AC

## 2020-05-12 MED ORDER — TRANEXAMIC ACID 1000 MG/10ML IV SOLN
INTRAVENOUS | Status: DC | PRN
  Administered 2020-05-12: 1000 mg via TOPICAL

## 2020-05-12 MED ORDER — ORAL CARE MOUTH RINSE: 15.0000 mL | Freq: Once | OROMUCOSAL | Status: AC

## 2020-05-12 MED ORDER — ACETAMINOPHEN 500 MG PO TABS: 1000.0000 mg | ORAL_TABLET | Freq: Four times a day (QID) | ORAL | Status: DC

## 2020-05-12 MED ORDER — OXYCODONE HCL 5 MG PO TABS: 5.0000 mg | ORAL_TABLET | Freq: Once | ORAL | Status: DC | PRN

## 2020-05-12 MED ORDER — CHLORHEXIDINE GLUCONATE 0.12 % MT SOLN
OROMUCOSAL | Status: AC
  Filled 2020-05-12: qty 15

## 2020-05-12 MED ORDER — MIDAZOLAM HCL 2 MG/2ML IJ SOLN
INTRAMUSCULAR | Status: AC
  Filled 2020-05-12: qty 2

## 2020-05-12 MED ORDER — MIDAZOLAM HCL 2 MG/2ML IJ SOLN: 1.0000 mg | Freq: Once | INTRAMUSCULAR | Status: AC

## 2020-05-12 MED ORDER — SUGAMMADEX SODIUM 200 MG/2ML IV SOLN
INTRAVENOUS | Status: DC | PRN
  Administered 2020-05-12: 200 mg via INTRAVENOUS

## 2020-05-12 MED ORDER — BUPIVACAINE-EPINEPHRINE (PF) 0.5% -1:200000 IJ SOLN
INTRAMUSCULAR | Status: AC
  Filled 2020-05-12: qty 30

## 2020-05-12 MED ORDER — CEFAZOLIN SODIUM-DEXTROSE 2-4 GM/100ML-% IV SOLN
INTRAVENOUS | Status: AC
  Administered 2020-05-12: 13:00:00 2 g via INTRAVENOUS
  Filled 2020-05-12: qty 100

## 2020-05-12 MED ORDER — CHLORHEXIDINE GLUCONATE 0.12 % MT SOLN
15.0000 mL | Freq: Once | OROMUCOSAL | Status: AC
  Administered 2020-05-12: 07:00:00 15 mL via OROMUCOSAL

## 2020-05-12 MED ORDER — FAMOTIDINE 20 MG PO TABS
20.0000 mg | ORAL_TABLET | Freq: Once | ORAL | Status: AC
  Administered 2020-05-12: 07:00:00 20 mg via ORAL

## 2020-05-12 MED ORDER — LACTATED RINGERS IV SOLN: INTRAVENOUS | Status: DC | PRN

## 2020-05-12 MED ORDER — SODIUM CHLORIDE 0.9 % IV SOLN: INTRAVENOUS | Status: DC

## 2020-05-12 MED ORDER — BUPIVACAINE HCL (PF) 0.5 % IJ SOLN
INTRAMUSCULAR | Status: DC | PRN
  Administered 2020-05-12: 20 mL
  Administered 2020-05-12: 10 mL

## 2020-05-12 MED ORDER — SEVOFLURANE IN SOLN
RESPIRATORY_TRACT | Status: AC
  Filled 2020-05-12: qty 250

## 2020-05-12 MED ORDER — KETOROLAC TROMETHAMINE 30 MG/ML IJ SOLN
INTRAMUSCULAR | Status: AC
  Administered 2020-05-12: 12:00:00 30 mg via INTRAVENOUS
  Filled 2020-05-12: qty 1

## 2020-05-12 MED ORDER — OXYCODONE HCL 5 MG/5ML PO SOLN: 5.0000 mg | Freq: Once | ORAL | Status: DC | PRN

## 2020-05-12 MED ORDER — TRANEXAMIC ACID 1000 MG/10ML IV SOLN
INTRAVENOUS | Status: AC
  Filled 2020-05-12: qty 10

## 2020-05-12 MED ORDER — FENTANYL CITRATE (PF) 100 MCG/2ML IJ SOLN: 50.0000 ug | Freq: Once | INTRAMUSCULAR | Status: AC

## 2020-05-12 MED ORDER — MIDAZOLAM HCL 2 MG/2ML IJ SOLN
INTRAMUSCULAR | Status: AC
  Administered 2020-05-12: 07:00:00 1 mg via INTRAVENOUS
  Filled 2020-05-12: qty 2

## 2020-05-12 MED ORDER — ACETAMINOPHEN 325 MG PO TABS: 325.0000 mg | ORAL_TABLET | Freq: Four times a day (QID) | ORAL | Status: DC | PRN

## 2020-05-12 MED ORDER — FENTANYL CITRATE (PF) 100 MCG/2ML IJ SOLN
INTRAMUSCULAR | Status: AC
  Filled 2020-05-12: qty 2

## 2020-05-12 MED ORDER — FENTANYL CITRATE (PF) 100 MCG/2ML IJ SOLN
INTRAMUSCULAR | Status: AC
  Administered 2020-05-12: 07:00:00 50 ug via INTRAVENOUS
  Filled 2020-05-12: qty 2

## 2020-05-12 MED ORDER — METOCLOPRAMIDE HCL 5 MG/ML IJ SOLN: 5.0000 mg | Freq: Three times a day (TID) | INTRAMUSCULAR | Status: DC | PRN

## 2020-05-12 MED ORDER — POTASSIUM CHLORIDE IN NACL 20-0.9 MEQ/L-% IV SOLN
INTRAVENOUS | Status: DC
  Filled 2020-05-12 (×3): qty 1000

## 2020-05-12 MED ORDER — CEFAZOLIN SODIUM-DEXTROSE 2-4 GM/100ML-% IV SOLN: 2.0000 g | Freq: Four times a day (QID) | INTRAVENOUS | Status: DC

## 2020-05-12 MED ORDER — FENTANYL CITRATE (PF) 100 MCG/2ML IJ SOLN
INTRAMUSCULAR | Status: DC | PRN
  Administered 2020-05-12: 50 ug via INTRAVENOUS

## 2020-05-12 MED ORDER — CEFAZOLIN SODIUM-DEXTROSE 2-4 GM/100ML-% IV SOLN
INTRAVENOUS | Status: AC
  Filled 2020-05-12: qty 100

## 2020-05-12 MED ORDER — ONDANSETRON HCL 4 MG/2ML IJ SOLN: 4.0000 mg | Freq: Four times a day (QID) | INTRAMUSCULAR | Status: DC | PRN

## 2020-05-12 MED ORDER — ROCURONIUM BROMIDE 100 MG/10ML IV SOLN
INTRAVENOUS | Status: DC | PRN
  Administered 2020-05-12: 20 mg via INTRAVENOUS
  Administered 2020-05-12: 50 mg via INTRAVENOUS

## 2020-05-12 MED ORDER — BUPIVACAINE LIPOSOME 1.3 % IJ SUSP
INTRAMUSCULAR | Status: AC
  Filled 2020-05-12: qty 20

## 2020-05-12 MED ORDER — CEFAZOLIN SODIUM-DEXTROSE 2-4 GM/100ML-% IV SOLN
2.0000 g | INTRAVENOUS | Status: AC
  Administered 2020-05-12: 08:00:00 2 g via INTRAVENOUS

## 2020-05-12 MED ORDER — DEXAMETHASONE SODIUM PHOSPHATE 10 MG/ML IJ SOLN
INTRAMUSCULAR | Status: DC | PRN
  Administered 2020-05-12: 10 mg via INTRAVENOUS

## 2020-05-12 MED ORDER — ONDANSETRON HCL 4 MG/2ML IJ SOLN: 4.0000 mg | Freq: Once | INTRAMUSCULAR | Status: DC | PRN

## 2020-05-12 MED ORDER — LIDOCAINE HCL (PF) 1 % IJ SOLN
INTRAMUSCULAR | Status: DC | PRN
  Administered 2020-05-12: 2 mL via INTRADERMAL

## 2020-05-12 MED ORDER — BUPIVACAINE LIPOSOME 1.3 % IJ SUSP
INTRAMUSCULAR | Status: DC | PRN
  Administered 2020-05-12 (×2): 5 mL
  Administered 2020-05-12: 10 mL

## 2020-05-12 MED ORDER — LIDOCAINE HCL (CARDIAC) PF 100 MG/5ML IV SOSY
PREFILLED_SYRINGE | INTRAVENOUS | Status: DC | PRN
  Administered 2020-05-12: 60 mg via INTRAVENOUS

## 2020-05-12 MED ORDER — METOCLOPRAMIDE HCL 10 MG PO TABS: 5.0000 mg | ORAL_TABLET | Freq: Three times a day (TID) | ORAL | Status: DC | PRN

## 2020-05-12 MED ORDER — SODIUM CHLORIDE 0.9 % IV SOLN
INTRAVENOUS | Status: DC | PRN
  Administered 2020-05-12: 09:00:00 30 ug/min via INTRAVENOUS

## 2020-05-12 MED ORDER — OXYCODONE HCL 5 MG PO TABS: 5.0000 mg | ORAL_TABLET | ORAL | 0 refills | Status: DC | PRN

## 2020-05-12 MED ORDER — PROPOFOL 10 MG/ML IV BOLUS
INTRAVENOUS | Status: DC | PRN
  Administered 2020-05-12: 160 mg via INTRAVENOUS

## 2020-05-12 MED ORDER — BUPIVACAINE HCL (PF) 0.5 % IJ SOLN
INTRAMUSCULAR | Status: AC
  Filled 2020-05-12: qty 10

## 2020-05-12 MED ORDER — OXYCODONE HCL 5 MG PO TABS: 5.0000 mg | ORAL_TABLET | ORAL | Status: DC | PRN

## 2020-05-12 MED ORDER — BUPIVACAINE HCL (PF) 0.5 % IJ SOLN
INTRAMUSCULAR | Status: DC | PRN
  Administered 2020-05-12 (×2): 5 mL

## 2020-05-12 MED ORDER — FAMOTIDINE 20 MG PO TABS
ORAL_TABLET | ORAL | Status: AC
  Filled 2020-05-12: qty 1

## 2020-05-12 MED ORDER — LIDOCAINE HCL (PF) 1 % IJ SOLN
INTRAMUSCULAR | Status: AC
  Filled 2020-05-12: qty 5

## 2020-05-12 SURGICAL SUPPLY — 89 items
BASEPLATE GLENOSPHERE 25 (Plate) ×3 IMPLANT
BASEPLATE GLENOSPHERE 25MM (Plate) ×1 IMPLANT
BEARING HUMERAL +3 40D (Joint) ×4 IMPLANT
BIT DRILL TWIST 2.7 (BIT) ×3 IMPLANT
BIT DRILL TWIST 2.7MM (BIT) ×1
BLADE SAW SAG 25X90X1.19 (BLADE) ×4 IMPLANT
BNDG COHESIVE 4X5 TAN STRL (GAUZE/BANDAGES/DRESSINGS) ×4 IMPLANT
BNDG ELASTIC 2X5.8 VLCR STR LF (GAUZE/BANDAGES/DRESSINGS) ×4 IMPLANT
BNDG ESMARK 4X12 TAN STRL LF (GAUZE/BANDAGES/DRESSINGS) ×4 IMPLANT
CANISTER SUCT 1200ML W/VALVE (MISCELLANEOUS) ×4 IMPLANT
CANISTER SUCT 3000ML PPV (MISCELLANEOUS) ×8 IMPLANT
CHLORAPREP W/TINT 26 (MISCELLANEOUS) ×8 IMPLANT
COOLER POLAR GLACIER W/PUMP (MISCELLANEOUS) ×4 IMPLANT
CORD BIP STRL DISP 12FT (MISCELLANEOUS) ×4 IMPLANT
COVER BACK TABLE REUSABLE LG (DRAPES) ×4 IMPLANT
COVER WAND RF STERILE (DRAPES) ×4 IMPLANT
CUFF TOURN SGL QUICK 18X4 (TOURNIQUET CUFF) ×4 IMPLANT
DIAL VERSA SHOULDER 40 STD (Joint) ×4 IMPLANT
DRAPE 3/4 80X56 (DRAPES) ×8 IMPLANT
DRAPE IMP U-DRAPE 54X76 (DRAPES) ×8 IMPLANT
DRAPE INCISE IOBAN 66X45 STRL (DRAPES) ×8 IMPLANT
DRAPE SURG 17X11 SM STRL (DRAPES) ×4 IMPLANT
DRSG OPSITE POSTOP 4X8 (GAUZE/BANDAGES/DRESSINGS) ×4 IMPLANT
ELECT BLADE 6.5 EXT (BLADE) IMPLANT
ELECT CAUTERY BLADE 6.4 (BLADE) ×4 IMPLANT
FORCEPS JEWEL BIP 4-3/4 STR (INSTRUMENTS) ×4 IMPLANT
GAUZE SPONGE 4X4 12PLY STRL (GAUZE/BANDAGES/DRESSINGS) ×4 IMPLANT
GAUZE XEROFORM 1X8 LF (GAUZE/BANDAGES/DRESSINGS) ×4 IMPLANT
GLOVE BIO SURGEON STRL SZ7.5 (GLOVE) ×16 IMPLANT
GLOVE BIO SURGEON STRL SZ8 (GLOVE) ×16 IMPLANT
GLOVE BIOGEL PI IND STRL 8 (GLOVE) ×2 IMPLANT
GLOVE BIOGEL PI INDICATOR 8 (GLOVE) ×2
GLOVE INDICATOR 8.0 STRL GRN (GLOVE) ×4 IMPLANT
GOWN STRL REUS W/ TWL LRG LVL3 (GOWN DISPOSABLE) ×2 IMPLANT
GOWN STRL REUS W/ TWL XL LVL3 (GOWN DISPOSABLE) ×2 IMPLANT
GOWN STRL REUS W/TWL LRG LVL3 (GOWN DISPOSABLE) ×2
GOWN STRL REUS W/TWL XL LVL3 (GOWN DISPOSABLE) ×2
HOOD PEEL AWAY FLYTE STAYCOOL (MISCELLANEOUS) ×8 IMPLANT
ILLUMINATOR WAVEGUIDE N/F (MISCELLANEOUS) ×4 IMPLANT
KIT CARPAL TUNNEL (MISCELLANEOUS) ×2
KIT ESCP INSRT D SLOT CANN KN (MISCELLANEOUS) ×2 IMPLANT
KIT STABILIZATION SHOULDER (MISCELLANEOUS) ×4 IMPLANT
KIT TURNOVER KIT A (KITS) ×4 IMPLANT
MANIFOLD NEPTUNE II (INSTRUMENTS) ×4 IMPLANT
MASK FACE SPIDER DISP (MASK) ×4 IMPLANT
MAT ABSORB  FLUID 56X50 GRAY (MISCELLANEOUS) ×2
MAT ABSORB FLUID 56X50 GRAY (MISCELLANEOUS) ×2 IMPLANT
NDL SAFETY ECLIPSE 18X1.5 (NEEDLE) ×2 IMPLANT
NEEDLE HYPO 18GX1.5 SHARP (NEEDLE) ×2
NEEDLE HYPO 22GX1.5 SAFETY (NEEDLE) ×4 IMPLANT
NEEDLE REVERSE CUT 1/2 CRC (NEEDLE) ×4 IMPLANT
NEEDLE SPNL 20GX3.5 QUINCKE YW (NEEDLE) ×4 IMPLANT
NS IRRIG 500ML POUR BTL (IV SOLUTION) ×4 IMPLANT
PACK ARTHROSCOPY SHOULDER (MISCELLANEOUS) ×4 IMPLANT
PACK EXTREMITY ARMC (MISCELLANEOUS) ×4 IMPLANT
PAD ARMBOARD 7.5X6 YLW CONV (MISCELLANEOUS) ×4 IMPLANT
PAD WRAPON POLAR SHDR UNIV (MISCELLANEOUS) ×2 IMPLANT
PENCIL SMOKE EVACUATOR (MISCELLANEOUS) ×4 IMPLANT
PIN THREADED REVERSE (PIN) ×4 IMPLANT
PULSAVAC PLUS IRRIG FAN TIP (DISPOSABLE) ×4
SCREW BONE LOCKING 4.75X35X3.5 (Screw) ×4 IMPLANT
SCREW BONE LOCKING 4.75X40X3.5 (Screw) ×4 IMPLANT
SCREW BONE STRL 6.5MMX30MM (Screw) ×4 IMPLANT
SCREW LOCKING 4.75MMX15MM (Screw) ×8 IMPLANT
SLING ULTRA II M (MISCELLANEOUS) ×4 IMPLANT
SOL .9 NS 3000ML IRR  AL (IV SOLUTION) ×2
SOL .9 NS 3000ML IRR UROMATIC (IV SOLUTION) ×2 IMPLANT
SPLINT WRIST LG LT TX990309 (SOFTGOODS) ×4 IMPLANT
SPLINT WRIST LG RT TX900304 (SOFTGOODS) IMPLANT
SPLINT WRIST M LT TX990308 (SOFTGOODS) IMPLANT
SPLINT WRIST M RT TX990303 (SOFTGOODS) IMPLANT
SPLINT WRIST XL LT TX990310 (SOFTGOODS) IMPLANT
SPLINT WRIST XL RT TX990305 (SOFTGOODS) IMPLANT
SPONGE LAP 18X18 RF (DISPOSABLE) ×4 IMPLANT
STAPLER SKIN PROX 35W (STAPLE) ×4 IMPLANT
STEM HUMERAL STRL 11MMX55MM (Stem) ×4 IMPLANT
STOCKINETTE IMPERVIOUS 9X36 MD (GAUZE/BANDAGES/DRESSINGS) ×4 IMPLANT
SUT ETHIBOND 0 MO6 C/R (SUTURE) ×4 IMPLANT
SUT FIBERWIRE #2 38 BLUE 1/2 (SUTURE) ×16
SUT PROLENE 4 0 PS 2 18 (SUTURE) ×4 IMPLANT
SUT VIC AB 0 CT1 36 (SUTURE) ×4 IMPLANT
SUT VIC AB 2-0 CT1 27 (SUTURE) ×4
SUT VIC AB 2-0 CT1 TAPERPNT 27 (SUTURE) ×4 IMPLANT
SUTURE FIBERWR #2 38 BLUE 1/2 (SUTURE) ×8 IMPLANT
SYR 10ML LL (SYRINGE) ×4 IMPLANT
SYR 30ML LL (SYRINGE) IMPLANT
TIP FAN IRRIG PULSAVAC PLUS (DISPOSABLE) ×2 IMPLANT
TRAY HUM MINI SHOULDER +0 40D (Shoulder) ×4 IMPLANT
WRAPON POLAR PAD SHDR UNIV (MISCELLANEOUS) ×4

## 2020-05-12 NOTE — Op Note (Addendum)
05/12/2020  10:52 AM  Patient:   Jeremiah Mueller  Pre-Op Diagnosis:   1.  Rotator cuff arthropathy, right shoulder. 2.  Right carpal tunnel syndrome.  Post-Op Diagnosis:   Same  Procedure:   1.  Reverse right total shoulder arthroplasty.  2.  Endoscopic right carpal tunnel release.  Surgeon:   Maryagnes Amos, MD  Assistant:   Horris Latino, PA-C  Anesthesia:   General endotracheal with an interscalene block placed preoperatively by the anesthesiologist.  Findings:   As above.  Complications:   None  EBL:    100 cc  Fluids:   800 cc crystalloid  UOP:   None  TT:   15 minutes at 250 mmHg  Drains:   None  Closure:   Staples for the shoulder, 4-0 Prolene interrupted sutures for the wrist  Implants:   All press-fit Biomet Comprehensive system with a #11 micro-humeral stem, a 40 mm humeral tray with a +3 mm insert, and a mini-base plate with a 40 mm glenosphere.  Brief Clinical Note:   The patient is a 64 year old male with a long history of gradually worsening right shoulder pain and weakness. His symptoms have progressed despite medications, activity modifications, injections, etc. His history and examination are consistent with a chronic irreparable rotator cuff tear with cuff arthropathy. The patient presents at this time for a reverse right total shoulder arthroplasty.  Patient also has a long history of pain and paresthesias to his right hand.  His symptoms have persisted despite medications, activity modification, etc.  His history and examination are consistent with carpal tunnel syndrome.  The patient also presents at this time for an endoscopic right carpal tunnel release.  Procedure:   The patient underwent placement of an interscalene block by the anesthesiologist in the preoperative holding area before being brought into the operating room and lain in the supine position. The patient then underwent general endotracheal intubation and anesthesia. The right hand and  upper extremity were prepped with ChloraPrep solution before being draped sterilely. Preoperative antibiotics were administered. A timeout was performed to verify the appropriate surgical site before the limb was exsanguinated with an Esmarch and the tourniquet inflated to 250 mmHg.   An approximately 1.5-2 cm incision was made over the volar wrist flexion crease, centered over the palmaris longus tendon. The incision was carried down through the subcutaneous tissues with care taken to identify and protect any neurovascular structures. The distal forearm fascia was penetrated just proximal to the transverse carpal ligament. The soft tissues were released off the superficial and deep surfaces of the distal forearm fascia and this was released proximally for 3-4 cm under direct visualization.  Attention was directed distally. The Therapist, nutritional was passed beneath the transverse carpal ligament along the ulnar aspect of the carpal tunnel and used to release any adhesions as well as to remove any adherent synovial tissue before first the smaller then the larger of the two dilators were passed beneath the transverse carpal ligament along the ulnar margin of the carpal tunnel. The slotted cannula was introduced and the endoscope was placed into the slotted cannula and the undersurface of the transverse carpal ligament visualized. The distal margin of the transverse carpal ligament was marked by placing a 25-gauge needle percutaneously at [place]'s cardinal point so that it entered the distal portion of the slotted cannula. Under endoscopic visualization, the transverse carpal ligament was released from proximal to distal using the end-cutting blade. A second pass was performed to ensure complete  release of the ligament. The adequacy of release was verified both endoscopically and by palpation using the freer elevator.  The wound was irrigated thoroughly with sterile saline solution before being closed using 4-0  Prolene interrupted sutures. A total of 10 cc of 0.5% plain Sensorcaine was injected in and around the incision before a sterile bulky dressing was applied to the wound.  Attention was then directed to the right shoulder. The patient was repositioned in the beach chair position using the beach chair positioner. The right shoulder and upper extremity were prepped with ChloraPrep solution before being draped sterilely. Preoperative antibiotics were administered. A standard anterior approach to the shoulder was made through an approximately 4-5 inch incision. The incision was carried down through the subcutaneous tissues to expose the deltopectoral fascia. The interval between the deltoid and pectoralis muscles was identified and this plane developed, retracting the cephalic vein laterally with the deltoid muscle. The conjoined tendon was identified. Its lateral margin was dissected and the Kolbel self-retraining retractor inserted. The "three sisters" were identified and cauterized. Bursal tissues were removed to improve visualization. The upper portion of the subscapularis tendon appeared to be chronically torn. The remainder of the tendon was released from its attachment to the lesser tuberosity 1 cm proximal to its insertion and several tagging sutures placed. The inferior capsule was released with care after identifying and protecting the axillary nerve. The proximal humeral cut was made at approximately 25 of retroversion using the extra-medullary guide.   Attention was redirected to the glenoid. The labrum was debrided circumferentially before the center of the glenoid was marked with electrocautery. The guidewire was drilled into the glenoid neck using the appropriate guide. After verifying its position, it was overreamed with the mini-baseplate reamer to create a flat surface. The permanent mini-baseplate was impacted into place. It was stabilized with a 30 x 6.5 mm central screw and four peripheral  locking screws of appropriate length. The permanent 40 mm glenosphere was then impacted into place and its Morse taper locking mechanism verified using manual distraction.  Attention was directed to the humeral side. The humeral canal was reamed sequentially beginning with the end-cutting reamer then progressing from a 4 mm reamer up to an 11 mm reamer. This provided excellent circumferential chatter. The canal was broached beginning with a #8 broach and progressing to a #11 broach. This was left in place and a trial reduction performed using first the standard trial humeral platform with the +0 mm insert and then the +3 mm trial insert. With the +3 mm insert, the arm demonstrated excellent range of motion as the hand could be brought across the chest to the opposite shoulder and brought to the top of the patient's head and to the patient's ear. The shoulder appeared stable throughout this range of motion. The joint was dislocated and the trial components removed. The permanent #11 micro-stem was impacted into place with care taken to maintain the appropriate version. The permanent 40 mm humeral platform with the +3 mm insert was put together on the back table and impacted into place. Again, the [facility] taper locking mechanism was verified using manual distraction. The shoulder was relocated using two finger pressure and again placed through a range of motion with the findings as described above.  The wound was copiously irrigated with sterile saline solution using the jet lavage system before a total of 20 cc of Exparel diluted out to 60 cc with normal saline and 30 cc of 0.5% Sensorcaine with epinephrine  was injected into the pericapsular and peri-incisional tissues to help with postoperative analgesia. The subscapularis tendon was reapproximated using #2 FiberWire interrupted sutures. The deltopectoral interval was closed using #0 Vicryl interrupted sutures before the subcutaneous tissues were closed using 2-0  Vicryl interrupted sutures. The skin was closed using staples. Prior to closing the skin, 1 g of transexemic acid in 10 cc of normal saline was injected intra-articularly to help with postoperative bleeding. A sterile occlusive dressing was applied to the wound before the arm was placed into a shoulder immobilizer with an abduction pillow. A Polar Care system also was applied to the shoulder. Finally, a Velcro wrist immobilizer was applied to the right wrist. The patient was then transferred back to a hospital bed before being awakened, extubated, and returned to the recovery room in satisfactory condition after tolerating the procedure well.

## 2020-05-12 NOTE — Discharge Instructions (Addendum)
Orthopedic discharge instructions: Keep shoulder dressing intact until follow-up appointment.  May shower after wrist dressing changed on post-op day #4 (Saturday).  Cover sutures with Band-Aids after drying off, then reapply Velcro wrist splint and shoulder immobilizer. Apply ice frequently to shoulder and wrist or use Polar Care device. Take ibuprofen 600-800 mg TID with meals for 7-10 days, then as necessary. Take oxycodone as prescribed when needed.  May supplement with ES Tylenol if necessary. Keep shoulder immobilizer on at all times except may remove for bathing purposes and for exercises. Follow-up in 10-14 days or as scheduled.   AMBULATORY SURGERY  DISCHARGE INSTRUCTIONS   1) The drugs that you were given will stay in your system until tomorrow so for the next 24 hours you should not:  A) Drive an automobile B) Make any legal decisions C) Drink any alcoholic beverage   2) You may resume regular meals tomorrow.  Today it is better to start with liquids and gradually work up to solid foods.  You may eat anything you prefer, but it is better to start with liquids, then soup and crackers, and gradually work up to solid foods.   3) Please notify your doctor immediately if you have any unusual bleeding, trouble breathing, redness and pain at the surgery site, drainage, fever, or pain not relieved by medication.    4) Additional Instructions:        Please contact your physician with any problems or Same Day Surgery at [phone], Monday through Friday 6 am to 4 pm, or [place] at [facility] number at [phone].   Interscalene Nerve Block with Exparel  1.  For your surgery you have received an Interscalene Nerve Block with Exparel. 2. Nerve Blocks affect many types of nerves, including nerves that control movement, pain and normal sensation.  You may experience feelings such as numbness, tingling, heaviness, weakness or the inability to move your arm or the  feeling or sensation that your arm has "fallen asleep". 3. A nerve block with Exparel can last up to 5 days.  Usually the weakness wears off first.  The tingling and heaviness usually wear off next.  Finally you may start to notice pain.  Keep in mind that this may occur in any order.  Once a nerve block starts to wear off it is usually completely gone within 60 minutes. 4. ISNB may cause mild shortness of breath, a hoarse voice, blurry vision, unequal pupils, or drooping of the face on the same side as the nerve block.  These symptoms will usually resolve with the numbness.  Very rarely the procedure itself can cause mild seizures. 5. If needed, your surgeon will give you a prescription for pain medication.  It will take about 60 minutes for the oral pain medication to become fully effective.  So, it is recommended that you start taking this medication before the nerve block first begins to wear off, or when you first begin to feel discomfort. 6. Take your pain medication only as prescribed.  Pain medication can cause sedation and decrease your breathing if you take more than you need for the level of pain that you have. 7. Nausea is a common side effect of many pain medications.  You may want to eat something before taking your pain medicine to prevent nausea. 8. After an Interscalene nerve block, you cannot feel pain, pressure or extremes in temperature in the effected arm.  Because your arm is numb it is at an increased risk for  injury.  To decrease the possibility of injury, please practice the following:  a. While you are awake change the position of your arm frequently to prevent too much pressure on any one area for prolonged periods of time. b.  If you have a cast or tight dressing, check the color or your fingers every couple of hours.  Call your surgeon with the appearance of any discoloration (white or blue). c. If you are given a sling to wear before you go home, please wear it  at all times  until the block has completely worn off.  Do not get up at night without your sling. d. Please contact [facility] or your surgeon if you do not begin to regain sensation after 7 days from the surgery.  Anesthesia may be contacted by calling the Same Day Surgery Department, Mon. through Fri., 6 am to 4 pm at [phone].   e. If you experience any other problems or concerns, please contact your surgeon's office. If you experience severe or prolonged shortness of breath go to the nearest emergency department.

## 2020-05-12 NOTE — Anesthesia Preprocedure Evaluation (Addendum)
Anesthesia Evaluation  Patient identified by MRN, date of birth, ID band Patient awake    Reviewed: Allergy & Precautions, H&P , NPO status , Patient's Chart, lab work & pertinent test results  History of Anesthesia Complications Negative for: history of anesthetic complications  Airway Mallampati: II  TM Distance: >3 FB     Dental  (+) Edentulous Upper, Missing   Pulmonary neg sleep apnea, neg COPD, Current Smoker,    breath sounds clear to auscultation       Cardiovascular (-) angina(-) Past MI and (-) Cardiac Stents negative cardio ROS  (-) dysrhythmias  Rhythm:regular Rate:Normal     Neuro/Psych negative neurological ROS  negative psych ROS   GI/Hepatic negative GI ROS, Neg liver ROS,   Endo/Other  diabetes  Renal/GU      Musculoskeletal   Abdominal   Peds  Hematology negative hematology ROS (+)   Anesthesia Other Findings Past Medical History: No date: Carpal tunnel syndrome     Comment:  right   (left corrected) No date: DDD (degenerative disc disease), cervical No date: Diabetes mellitus without complication (HCC) No date: Hypercholesterolemia  Past Surgical History: No date: APPENDECTOMY 01/21/2020: CARPAL TUNNEL RELEASE; Left     Comment:  Procedure: CARPAL TUNNEL RELEASE ENDOSCOPIC;  Surgeon:               Christena Flake, MD;  Location: [facility] ORS;  Service:               Orthopedics;  Laterality: Left; No date: colonscopy 01/21/2020: REVERSE SHOULDER ARTHROPLASTY; Left     Comment:  Procedure: REVERSE SHOULDER ARTHROPLASTY - RNFA;                Surgeon: Christena Flake, MD;  Location: [facility] ORS;                Service: Orthopedics;  Laterality: Left;  BMI    Body Mass Index: 21.06 kg/m      Reproductive/Obstetrics negative OB ROS                            Anesthesia Physical Anesthesia Plan  ASA: II  Anesthesia Plan: General ETT   Post-op Pain Management:     Induction:   PONV Risk Score and Plan: Ondansetron, Dexamethasone, Midazolam and Treatment may vary due to age or medical condition  Airway Management Planned:   Additional Equipment:   Intra-op Plan:   Post-operative Plan:   Informed Consent: I have reviewed the patients History and Physical, chart, labs and discussed the procedure including the risks, benefits and alternatives for the proposed anesthesia with the patient or authorized representative who has indicated his/her understanding and acceptance.     Dental Advisory Given  Plan Discussed with: Anesthesiologist, CRNA and Surgeon  Anesthesia Plan Comments:         Anesthesia Quick Evaluation

## 2020-05-12 NOTE — Anesthesia Procedure Notes (Signed)
Anesthesia Regional Block: Interscalene brachial plexus block   Pre-Anesthetic Checklist: ,, timeout performed, Correct Patient, Correct Site, Correct Laterality, Correct Procedure, Correct Position, site marked, Risks and benefits discussed,  Surgical consent,  Pre-op evaluation,  At surgeon's request and post-op pain management  Laterality: Right  Prep: chloraprep       Needles:  Injection technique: Single-shot  Needle Type: Stimiplex     Needle Length: 9cm  Needle Gauge: 21     Additional Needles:   Procedures:,,,, ultrasound used (permanent image in chart),,,,  Narrative:  Start time: 05/12/2020 7:28 AM End time: 05/12/2020 7:31 AM  Performed by: Personally  Anesthesiologist: Karleen Hampshire, MD  Additional Notes: Risks and benefits of nerve block discussed with patient, including but not limited to risk of nerve injury, bleeding, infection, and failed block.  Patient expressed understanding and consented to block placement.   Functioning IV was confirmed and monitors were applied.  Sterile prep,hand hygiene and sterile gloves were used.  Minimal sedation used for procedure.  During the procedure, there was negative aspiration, negative paresthesia on injection, and dose was given in divided aliquots under ultrasound guidance.  Patient tolerated the procedure well with no immediate complications.

## 2020-05-12 NOTE — H&P (Signed)
History of Present Illness:  Jeremiah Mueller is a 64 y.o. male who presents for follow-up of his persistent right shoulder pain. The patient has been diagnosed in the past with rotator cuff arthropathy of the right shoulder. He continues to have pain in the shoulder, especially with activities at or above shoulder level. He denies any reinjury to the shoulder, and denies any numbness or paresthesias down his arm to his hand. He has pain at night as well. Because his left shoulder symptoms were worse, surgery was performed on the left shoulder prior to the right shoulder. The patient feels that his left shoulder is doing sufficiently well that he would now like to discuss scheduling surgery for his right shoulder.  The patient also complains of persistent/recurrent pain and paresthesias to his right hand.  These symptoms are worse at night and with repetitive activities.  His symptoms have persisted despite medications, activity modifications, etc. He has undergone an endoscopic left carpal tunnel release in the past for similar symptoms with an excellent functional result.   Current Outpatient Medications: . blood glucose diagnostic (GLUCOSE BLOOD) test strip 1 each (1 strip total) by XX route 2 (two) times daily Use as instructed. 200 each 3  . blood glucose meter kit by XX route as directed 1 each 0  . lancing device with lancets kit Use 1 each 2 (two) times daily Use as instructed. 200 each 11  . metFORMIN (GLUCOPHAGE) 1000 MG tablet Take 1 tablet (1,000 mg total) by mouth 2 (two) times daily with meals for 180 days 60 tablet 5  . rosuvastatin (CRESTOR) 20 MG tablet Take 1 tablet (20 mg total) by mouth once daily 30 tablet 5   Allergies: No Known Allergies  Past Medical History:  . DDD (degenerative disc disease), lumbar 05/12/2016  MRI 11/16 with degenerative disc disease in the lumbar spine, with moderate to severe foraminal stenosis on the right at L5-S1  . Diabetes (CMS-HCC)   Past Surgical  History:  . APPENDECTOMY  . COLONOSCOPY 03/15/2013  Dr. Lina Sar @ [facility] - Adenomatous Polyp, PH Polyps, Poor Prep and rpt immediate per [place]  . COLONOSCOPY 11/02/2017  Dr. Christean Leaf @ [facility] - Adenomatous Polyp, rpt 3-5 yrs per provider  . TOTAL SHOULDER REPLACEMENT Left   Family History:  . Kidney disease Mother  . Kidney disease Father  . Diabetes Father  . High blood pressure (Hypertension) Father  . Breast cancer Sister  . Colon cancer Brother   Social History:   Socioeconomic History:  Marland Kitchen Marital status: Married  Spouse name: Not on file  . Number of children: Not on file  . Years of education: Not on file  . Highest education level: Not on file  Occupational History  . Not on file  Tobacco Use  . Smoking status: Current Every Day Smoker  Types: Cigars  . Smokeless tobacco: Never Used  Vaping Use  . Vaping Use: Never used  Substance and Sexual Activity  . Alcohol use: Yes  Alcohol/week: 5.0 standard drinks  Types: 5 Shots of liquor per week  . Drug use: No  . Sexual activity: Defer  Other Topics Concern  . Not on file  Social History Narrative  . Not on file   Social Determinants of Health:   Financial Resource Strain: Not on file  Food Insecurity: Not on file  Transportation Needs: Not on file   Review of Systems:  A comprehensive 14 point ROS was performed, reviewed, and the pertinent orthopaedic findings  are documented in the HPI.  Physical Exam: Vitals:  04/17/20 1341  BP: 120/80  Weight: 61.6 kg (135 lb 12.8 oz)  Height: 170.2 cm ($RemoveB'5\' 7"'jHotlvOx$ )  PainSc: 4  PainLoc: Shoulder   General/Constitutional: The patient appears to be well-nourished, well-developed, and in no acute distress. Neuro/Psych: Normal mood and affect, oriented to person, place and time.  Eyes: Non-icteric. Pupils are equal, round, and reactive to light, and exhibit synchronous movement. ENT: Unremarkable. Lymphatic: No palpable adenopathy. Respiratory: Lungs clear to auscultation, Normal  chest excursion, No wheezes and Non-labored breathing Cardiovascular: Regular rate and rhythm. No murmurs. and No edema, swelling or tenderness, except as noted in detailed exam. Integumentary: No impressive skin lesions present, except as noted in detailed exam. Musculoskeletal: Unremarkable, except as noted in detailed exam.  Rightshoulder exam: SKIN:Normal and without a Popeye deformity. SWELLING:None WARMTH:None LYMPH NODES: No adenopathy palpable CREPITUS:None TENDERNESS: Moderately tender focally along bicipital groove anteriorly ROM (active):  Forward flexion:90 degrees Abduction: 80 degrees Internal rotation: Right buttock ROM (passive):  Forward flexion: 140 degrees Abduction: 135 degrees ER/IR at 90 abd: 85 degrees/50 degrees  He experiences moderate pain at the extremes of forward flexion and abduction, more so than with internal rotation.  STRENGTH: Forward flexion: 4/5 Abduction: 4/5 External rotation: 4-4+/5 Internal rotation: 4+/5 Pain with RC testing: No  STABILITY:Normal  SPECIAL TESTS: Luan Pulling' test: Negative Speed's test: Negative Capsulitis - pain w/ passive ER: No Crossed arm test: Negative Crank: Not evaluated Anterior apprehension: Negative Posterior apprehension: Not evaluated  Right wrist/hand exam: Skin inspection of the right wrist and hand is unremarkable. No swelling, erythema, ecchymosis, abrasions, or other skin abnormalities are identified. He has no areas of tenderness to palpation over the right wrist or hand.  He demonstrates full active and passive range of motion of the wrist without any  pain or catching.  He is able to actively flex and extend all digits fully without any pain or triggering. He can oppose his thumb to the base of the little finger. He is neurovascularly intact to the right upper extremity and hand. He has a negative Phalen's test and an equivocally positive Tinel's test over the carpal tunnel.  Assessment: 1. Rotator cuff arthropathy, right.  2. Right carpal tunnel syndrome.   Plan: The treatment options were discussed with the patient. In addition, patient educational materials were provided regarding the diagnosis and treatment options. Regarding his right shoulder symptoms, the patient is quite frustrated by his persistent symptoms and function limitations, and is ready to proceed with surgical intervention on his right shoulder, specifically a reverse right total shoulder arthroplasty. In addition, the patient requests that his right carpal tunnel symptoms be addressed at the same time. These procedures were discussed with the patient, as were the potential risks (including bleeding, infection, nerve and/or blood vessel injury, persistent or recurrent pain/paresthesias, weakness of grip, loosening and/or failure of the components, dislocation, need for further surgery, blood clots, strokes, heart attacks and/or arhythmias, pneumonia, etc.) and benefits. The patient states his understanding and wishes to proceed. All of the patient's questions and concerns were answered. He can call any time with further concerns. He will follow up post-surgery, routine. He is to remain out of work at this time.   H&P reviewed and patient re-examined. No changes.

## 2020-05-12 NOTE — Anesthesia Procedure Notes (Signed)
Procedure Name: Intubation Date/Time: 05/12/2020 8:09 AM Performed by: Deri Fuelling, CRNA Pre-anesthesia Checklist: Patient identified, Emergency Drugs available, Suction available and Patient being monitored Patient Re-evaluated:Patient Re-evaluated prior to induction Oxygen Delivery Method: Circle system utilized Preoxygenation: Pre-oxygenation with 100% oxygen Induction Type: IV induction Ventilation: Mask ventilation without difficulty Laryngoscope Size: McGraph and 4 Grade View: Grade I Tube type: Oral Tube size: 7.5 mm Number of attempts: 1 Airway Equipment and Method: Stylet and Oral airway Placement Confirmation: ETT inserted through vocal cords under direct vision,  positive ETCO2 and breath sounds checked- equal and bilateral Secured at: 21 cm Tube secured with: Tape Dental Injury: Teeth and Oropharynx as per pre-operative assessment

## 2020-05-12 NOTE — Transfer of Care (Signed)
Immediate Anesthesia Transfer of Care Note  Patient: Jeremiah Mueller  Procedure(s) Performed: REVERSE SHOULDER ARTHROPLASTY (Right Shoulder) CARPAL TUNNEL RELEASE ENDOSCOPIC (Right )  Patient Location: PACU  Anesthesia Type:General  Level of Consciousness: awake  Airway & Oxygen Therapy: Patient Spontanous Breathing  Post-op Assessment: Report given to RN  Post vital signs: Reviewed  Last Vitals:  Vitals Value Taken Time  BP 119/83 05/12/20 1056  Temp 36.1 C 05/12/20 1056  Pulse 67 05/12/20 1100  Resp 17 05/12/20 1100  SpO2 100 % 05/12/20 1100  Vitals shown include unvalidated device data.  Last Pain:  Vitals:   05/12/20 0614  TempSrc: Tympanic         Complications: No complications documented.

## 2020-05-12 NOTE — Progress Notes (Signed)
   05/12/20 0815  Clinical Encounter Type  Visited With Family  Visit Type Initial  Referral From Chaplain  Consult/Referral To Chaplain  Chaplain visited with with Pt's wife to see how she was doing while she waited and she said she was doing fine.

## 2020-05-12 NOTE — Anesthesia Postprocedure Evaluation (Signed)
Anesthesia Post Note  Patient: Jeremiah Mueller  Procedure(s) Performed: REVERSE SHOULDER ARTHROPLASTY (Right Shoulder) CARPAL TUNNEL RELEASE ENDOSCOPIC (Right )  Patient location during evaluation: PACU Anesthesia Type: General Level of consciousness: awake and alert Pain management: pain level controlled Vital Signs Assessment: post-procedure vital signs reviewed and stable Respiratory status: spontaneous breathing, nonlabored ventilation and respiratory function stable Cardiovascular status: blood pressure returned to baseline and stable Postop Assessment: no apparent nausea or vomiting Anesthetic complications: no   No complications documented.   Last Vitals:  Vitals:   05/12/20 1141 05/12/20 1211  BP: 115/74 116/70  Pulse: 65 64  Resp: 13 16  Temp: (!) 36.1 C 36.5 C  SpO2: 100% 100%    Last Pain:  Vitals:   05/12/20 1211  TempSrc: Tympanic  PainSc: 3                  Karleen Hampshire

## 2020-05-13 ENCOUNTER — Encounter: Payer: Self-pay | Admitting: Surgery

## 2020-05-13 LAB — SURGICAL PATHOLOGY

## 2022-09-08 ENCOUNTER — Other Ambulatory Visit: Payer: Self-pay | Admitting: Physician Assistant

## 2022-09-08 DIAGNOSIS — R1011 Right upper quadrant pain: Secondary | ICD-10-CM

## 2022-09-09 ENCOUNTER — Ambulatory Visit
Admission: RE | Admit: 2022-09-09 | Discharge: 2022-09-09 | Disposition: A | Discharge status: Home / Self Care | Payer: Medicare Other | Source: Ambulatory Visit | Attending: Physician Assistant | Admitting: Physician Assistant

## 2022-09-09 DIAGNOSIS — R1011 Right upper quadrant pain: Secondary | ICD-10-CM | POA: Insufficient documentation

## 2022-09-30 ENCOUNTER — Other Ambulatory Visit: Payer: Self-pay

## 2022-09-30 DIAGNOSIS — R1011 Right upper quadrant pain: Secondary | ICD-10-CM

## 2022-10-03 ENCOUNTER — Ambulatory Visit
Admission: RE | Admit: 2022-10-03 | Discharge: 2022-10-03 | Disposition: A | Discharge status: Home / Self Care | Payer: Medicare Other | Source: Ambulatory Visit | Attending: General Surgery | Admitting: General Surgery

## 2022-10-03 DIAGNOSIS — R1011 Right upper quadrant pain: Secondary | ICD-10-CM | POA: Diagnosis present

## 2022-10-03 MED ORDER — IOHEXOL 300 MG/ML  SOLN
100.0000 mL | Freq: Once | INTRAMUSCULAR | Status: AC | PRN
  Administered 2022-10-03: 100 mL via INTRAVENOUS

## 2022-10-05 ENCOUNTER — Other Ambulatory Visit: Payer: Self-pay | Admitting: General Surgery

## 2022-10-05 DIAGNOSIS — R1011 Right upper quadrant pain: Secondary | ICD-10-CM

## 2022-10-13 ENCOUNTER — Encounter
Admission: RE | Admit: 2022-10-13 | Discharge: 2022-10-13 | Disposition: A | Discharge status: Home / Self Care | Payer: Medicare Other | Source: Ambulatory Visit | Attending: General Surgery | Admitting: General Surgery

## 2022-10-13 DIAGNOSIS — R1011 Right upper quadrant pain: Secondary | ICD-10-CM | POA: Diagnosis not present

## 2022-10-13 MED ORDER — TECHNETIUM TC 99M MEBROFENIN IV KIT
5.5100 | PACK | Freq: Once | INTRAVENOUS | Status: AC | PRN
  Administered 2022-10-13: 5.51 via INTRAVENOUS

## 2022-10-26 ENCOUNTER — Encounter: Payer: Self-pay | Admitting: Surgery

## 2022-11-02 ENCOUNTER — Ambulatory Visit: Payer: Medicare Other | Admitting: Anesthesiology

## 2022-11-02 ENCOUNTER — Encounter
Admission: RE | Disposition: A | Discharge status: Home / Self Care | Payer: Self-pay | Source: Home / Self Care | Attending: Surgery

## 2022-11-02 ENCOUNTER — Ambulatory Visit
Admission: RE | Admit: 2022-11-02 | Discharge: 2022-11-02 | Disposition: A | Discharge status: Home / Self Care | Payer: Medicare Other | Attending: Surgery | Admitting: Surgery

## 2022-11-02 ENCOUNTER — Other Ambulatory Visit: Payer: Self-pay

## 2022-11-02 ENCOUNTER — Encounter: Payer: Self-pay | Admitting: Surgery

## 2022-11-02 DIAGNOSIS — Z79899 Other long term (current) drug therapy: Secondary | ICD-10-CM | POA: Insufficient documentation

## 2022-11-02 DIAGNOSIS — Z96611 Presence of right artificial shoulder joint: Secondary | ICD-10-CM | POA: Insufficient documentation

## 2022-11-02 DIAGNOSIS — F17290 Nicotine dependence, other tobacco product, uncomplicated: Secondary | ICD-10-CM | POA: Insufficient documentation

## 2022-11-02 DIAGNOSIS — I10 Essential (primary) hypertension: Secondary | ICD-10-CM | POA: Insufficient documentation

## 2022-11-02 DIAGNOSIS — Z9049 Acquired absence of other specified parts of digestive tract: Secondary | ICD-10-CM | POA: Insufficient documentation

## 2022-11-02 DIAGNOSIS — R1013 Epigastric pain: Secondary | ICD-10-CM | POA: Diagnosis present

## 2022-11-02 DIAGNOSIS — Z7984 Long term (current) use of oral hypoglycemic drugs: Secondary | ICD-10-CM | POA: Diagnosis not present

## 2022-11-02 DIAGNOSIS — Z96612 Presence of left artificial shoulder joint: Secondary | ICD-10-CM | POA: Insufficient documentation

## 2022-11-02 DIAGNOSIS — G4730 Sleep apnea, unspecified: Secondary | ICD-10-CM | POA: Insufficient documentation

## 2022-11-02 DIAGNOSIS — E119 Type 2 diabetes mellitus without complications: Secondary | ICD-10-CM | POA: Diagnosis not present

## 2022-11-02 HISTORY — PX: ESOPHAGOGASTRODUODENOSCOPY (EGD) WITH PROPOFOL: SHX5813

## 2022-11-02 HISTORY — DX: Essential (primary) hypertension: I10

## 2022-11-02 LAB — GLUCOSE, CAPILLARY: Glucose-Capillary: 74 mg/dL (ref 70–99)

## 2022-11-02 SURGERY — ESOPHAGOGASTRODUODENOSCOPY (EGD) WITH PROPOFOL
Anesthesia: General

## 2022-11-02 MED ORDER — LIDOCAINE HCL (CARDIAC) PF 100 MG/5ML IV SOSY
PREFILLED_SYRINGE | INTRAVENOUS | Status: DC | PRN
  Administered 2022-11-02: 60 mg via INTRAVENOUS

## 2022-11-02 MED ORDER — GLYCOPYRROLATE 0.2 MG/ML IJ SOLN
INTRAMUSCULAR | Status: DC | PRN
  Administered 2022-11-02: .2 mg via INTRAVENOUS

## 2022-11-02 MED ORDER — PROPOFOL 500 MG/50ML IV EMUL
INTRAVENOUS | Status: DC | PRN
  Administered 2022-11-02: 150 ug/kg/min via INTRAVENOUS

## 2022-11-02 MED ORDER — SODIUM CHLORIDE 0.9 % IV SOLN: INTRAVENOUS | Status: DC

## 2022-11-02 MED ORDER — PROPOFOL 10 MG/ML IV BOLUS
INTRAVENOUS | Status: DC | PRN
  Administered 2022-11-02: 60 mg via INTRAVENOUS

## 2022-11-02 NOTE — Anesthesia Postprocedure Evaluation (Signed)
Anesthesia Post Note  Patient: Jeremiah Mueller  Procedure(s) Performed: ESOPHAGOGASTRODUODENOSCOPY (EGD) WITH PROPOFOL  Patient location during evaluation: Endoscopy Anesthesia Type: General Level of consciousness: awake and alert Pain management: pain level controlled Vital Signs Assessment: post-procedure vital signs reviewed and stable Respiratory status: spontaneous breathing, nonlabored ventilation, respiratory function stable and patient connected to nasal cannula oxygen Cardiovascular status: blood pressure returned to baseline and stable Postop Assessment: no apparent nausea or vomiting Anesthetic complications: no   No notable events documented.   Last Vitals:  Vitals:   11/02/22 0746 11/02/22 0818  BP: 107/73 101/67  Pulse: (!) 55 74  Resp: 16 20  Temp: (!) 35.7 C   SpO2: 97% 92%    Last Pain:  Vitals:   11/02/22 0818  TempSrc:   PainSc: 0-No pain                 Louie Boston

## 2022-11-02 NOTE — Anesthesia Preprocedure Evaluation (Signed)
Anesthesia Evaluation  Patient identified by MRN, date of birth, ID band Patient awake    Reviewed: Allergy & Precautions, NPO status , Patient's Chart, lab work & pertinent test results  History of Anesthesia Complications Negative for: history of anesthetic complications  Airway Mallampati: III  TM Distance: >3 FB Neck ROM: full    Dental  (+) Poor Dentition   Pulmonary sleep apnea , Current Smoker and Patient abstained from smoking.   Pulmonary exam normal        Cardiovascular hypertension, On Medications negative cardio ROS Normal cardiovascular exam     Neuro/Psych  Neuromuscular disease  negative psych ROS   GI/Hepatic negative GI ROS, Neg liver ROS,,,  Endo/Other  negative endocrine ROSdiabetes, Type 2, Oral Hypoglycemic Agents    Renal/GU negative Renal ROS  negative genitourinary   Musculoskeletal   Abdominal   Peds  Hematology negative hematology ROS (+)   Anesthesia Other Findings Past Medical History: No date: Carpal tunnel syndrome     Comment:  right   (left corrected) No date: DDD (degenerative disc disease), cervical No date: Diabetes mellitus without complication (HCC) No date: Hypercholesterolemia No date: Hypertension  Past Surgical History: No date: APPENDECTOMY 01/21/2020: CARPAL TUNNEL RELEASE; Left     Comment:  Procedure: CARPAL TUNNEL RELEASE ENDOSCOPIC;  Surgeon:               Christena Flake, MD;  Location: [facility] ORS;  Service:               Orthopedics;  Laterality: Left; 05/12/2020: CARPAL TUNNEL RELEASE; Right     Comment:  Procedure: CARPAL TUNNEL RELEASE ENDOSCOPIC;  Surgeon:               Christena Flake, MD;  Location: [facility] ORS;  Service:               Orthopedics;  Laterality: Right; No date: colonscopy 01/21/2020: REVERSE SHOULDER ARTHROPLASTY; Left     Comment:  Procedure: REVERSE SHOULDER ARTHROPLASTY - RNFA;                Surgeon: Christena Flake, MD;  Location: [facility] ORS;                 Service: Orthopedics;  Laterality: Left; 05/12/2020: REVERSE SHOULDER ARTHROPLASTY; Right     Comment:  Procedure: REVERSE SHOULDER ARTHROPLASTY;  Surgeon:               Christena Flake, MD;  Location: [facility] ORS;  Service:               Orthopedics;  Laterality: Right;  BMI    Body Mass Index: 20.37 kg/m      Reproductive/Obstetrics negative OB ROS                             Anesthesia Physical Anesthesia Plan  ASA: 3  Anesthesia Plan: General   Post-op Pain Management: Minimal or no pain anticipated   Induction: Intravenous  PONV Risk Score and Plan: 2 and Propofol infusion and TIVA  Airway Management Planned: Natural Airway and Nasal Cannula  Additional Equipment:   Intra-op Plan:   Post-operative Plan:   Informed Consent: I have reviewed the patients History and Physical, chart, labs and discussed the procedure including the risks, benefits and alternatives for the proposed anesthesia with the patient or authorized representative who has indicated his/her understanding and acceptance.     Dental  Advisory Given  Plan Discussed with: Anesthesiologist, CRNA and Surgeon  Anesthesia Plan Comments: (Patient consented for risks of anesthesia including but not limited to:  - adverse reactions to medications - risk of airway placement if required - damage to eyes, teeth, lips or other oral mucosa - nerve damage due to positioning  - sore throat or hoarseness - Damage to heart, brain, nerves, lungs, other parts of body or loss of life  Patient voiced understanding.)       Anesthesia Quick Evaluation

## 2022-11-02 NOTE — Transfer of Care (Signed)
Immediate Anesthesia Transfer of Care Note  Patient: Jeremiah Mueller  Procedure(s) Performed: ESOPHAGOGASTRODUODENOSCOPY (EGD) WITH PROPOFOL  Patient Location: Endoscopy Unit  Anesthesia Type:General  Level of Consciousness: drowsy  Airway & Oxygen Therapy: Patient Spontanous Breathing  Post-op Assessment: Report given to RN  Post vital signs: stable  Last Vitals:  Vitals Value Taken Time  BP    Temp    Pulse 64 11/02/22 0818  Resp 20 11/02/22 0818  SpO2 94 % 11/02/22 0818  Vitals shown include unvalidated device data.  Last Pain:  Vitals:   11/02/22 0746  TempSrc: Temporal  PainSc: 0-No pain         Complications: No notable events documented.

## 2022-11-02 NOTE — Op Note (Signed)
[facility] Gastroenterology Patient Name: Jeremiah Mueller Procedure Date: 11/02/2022 8:01 AM MRN: [id number] Account #: [id number] Date of Birth: Apr 02, 1956 Admit Type: Outpatient Age: 67 Room: [facility] ENDO ROOM 1 Gender: Male Note Status: Finalized Instrument Name: Upper Endoscope [id number] Procedure:             Upper GI endoscopy Indications:           Epigastric abdominal pain Providers:             Sung Amabile MD, MD Referring MD:          Daniel Nones, MD (Referring MD) Medicines:             Propofol per Anesthesia Complications:         No immediate complications. Procedure:             Pre-Anesthesia Assessment:                        - After reviewing the risks and benefits, the patient                         was deemed in satisfactory condition to undergo the                         procedure in an ambulatory setting.                        After obtaining informed consent, the endoscope was                         passed under direct vision. Throughout the procedure,                         the patient's blood pressure, pulse, and oxygen                         saturations were monitored continuously. The Endoscope                         was introduced through the mouth, and advanced to the                         second part of duodenum. The upper GI endoscopy was                         accomplished without difficulty. The patient tolerated                         the procedure well. Findings:      The esophagus was normal.      The stomach was normal.      The examined duodenum was normal. Impression:            - Normal esophagus.                        - Normal stomach.                        - Normal examined duodenum.                        -  No specimens collected. Recommendation:        - Discharge patient to home.                        - Resume previous diet.                        - Written discharge instructions were provided to the                          patient.                        - followup with Dr. Maia Plan Procedure Code(s):     --- Professional ---                        [phone], Esophagogastroduodenoscopy, flexible,                         transoral; diagnostic, including collection of                         specimen(s) by brushing or washing, when performed                         (separate procedure) Diagnosis Code(s):     --- Professional ---                        R10.13, Epigastric pain CPT copyright 2022 [employer]. All rights reserved. The codes documented in this report are preliminary and upon coder review may  be revised to meet current compliance requirements. Dr. Harrie Foreman, MD Sung Amabile MD, MD 11/02/2022 8:16:45 AM This report has been signed electronically. Number of Addenda: 0 Note Initiated On: 11/02/2022 8:01 AM Estimated Blood Loss:  Estimated blood loss: none.      [place]

## 2022-11-02 NOTE — H&P (Signed)
HISTORY OF PRESENT ILLNESS:  [name] is a 67 y.o.male patient who comes for evaluation of right upper quadrant pain.  Patient come for evaluation of right upper quadrant pain after workup with CT scan of the abdomen and pelvis and HIDA scan. Patient initially consulted to surgery. Abdominal ultrasound negative for gallbladder pathology. Due to patient right upper current pain I ordered a CT scan of the abdomen which was negative for any intra-abdominal pathology. Then I proceeded to order HIDA scan for evaluation of gallbladder dyskinesia. HIDA scan was positive for gallbladder radiation fraction of 90%. Patient endorses he continues having right upper current pain. Pain is aggravated by eating. Pain radiates to his right upper back. He can identify any alleviating factors.   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 Lina Sar @ [facility] - Adenomatous Polyp, PH Polyps, Poor Prep and rpt immediate per Peery COLONOSCOPY 11/02/2017 Dr. Christean Leaf @ [facility] - Adenomatous Polyp, rpt 3-5 yrs per provider 1. Reverse left total shoulder arthroplasty. 2. Endoscopic left carpal tunnel release. Left 01/21/2020 Dr. Joice Lofts 1. Reverse right total shoulder arthroplasty. 2. Endoscopic right carpal tunnel release. Right 05/12/2020 Dr. Joice Lofts COLONOSCOPY N/A 05/04/2021 Procedure: COLONOSCOPY; Surgeon: Mauri Reading, MD; Location: [facility]; Service: Gastroenterology; Laterality: N/A; APPENDECTOMY   MEDICATIONS: Outpatient Encounter Medications as of 10/18/2022 Medication Sig Dispense Refill blood glucose diagnostic test strip Use 1 strip 3 (three) times daily Use  as instructed. blood glucose meter kit Use as directed cholecalciferol 1000 unit tablet Take by mouth co-enzyme Q-10, ubiquinone, (CO Q-10) 100 mg capsule Take 100 mg by mouth once daily losartan (COZAAR) 25 MG tablet Take 1 tablet (25 mg total) by mouth once daily 90 tablet 3 metFORMIN (GLUCOPHAGE) 1000 MG tablet Take 1 tablet (1,000 mg total) by mouth 2 (two) times daily with meals 182 tablet 3 multivitamin tablet Take 1 tablet by mouth once daily rosuvastatin (CRESTOR) 20 MG tablet Take 1 tablet (20 mg total) by mouth once daily 91 tablet 3 ZINC ORAL Take by mouth blood glucose diagnostic (GLUCOSE BLOOD) test strip 1 each (1 strip total) 2 (two) times daily Use as instructed. 200 each 11 blood glucose meter kit as directed 1 each 0 lancing device with lancets kit Use 1 each 2 (two) times daily Use as instructed. 200 each 11  No facility-administered encounter medications on file as of 10/18/2022.   ALLERGIES: Patient has no known allergies.  SOCIAL HISTORY: Social History  Socioeconomic History Marital status: Divorced Tobacco Use Smoking status: Every Day Types: Cigars Smokeless tobacco: Never Tobacco comments: Few cigars a day Vaping Use Vaping status: Never Used Substance and Sexual Activity Alcohol use: Yes Alcohol/week: 5.0 standard drinks of alcohol Types: 5 Shots of liquor per week Drug use: No Sexual activity: Defer  FAMILY HISTORY: Family History Problem Relation Name Age of Onset Kidney disease Mother Kidney disease Father Diabetes Father High blood pressure (Hypertension) Father Breast cancer Sister Colon cancer Brother   GENERAL REVIEW OF SYSTEMS:  General ROS: negative for - chills, fatigue, fever, weight gain or weight loss Allergy and Immunology ROS: negative for - hives Hematological and Lymphatic ROS: negative for - bleeding problems or bruising, negative for palpable nodes Endocrine ROS: negative for - heat or  cold intolerance, hair  changes Respiratory ROS: negative for - cough, shortness of breath or wheezing Cardiovascular ROS: no chest pain or palpitations GI ROS: negative for nausea, vomiting, diarrhea, constipation. Positive for abdominal pain Musculoskeletal ROS: negative for - joint swelling or muscle pain Neurological ROS: negative for - confusion, syncope Dermatological ROS: negative for pruritus and rash  PHYSICAL EXAM: Vitals: 10/18/22 1334 BP: 104/57 Pulse: 65 . Ht:172.7 cm (5\' 8" ) Wt:60.8 kg (134 lb) ZOX:WRUE surface area is 1.71 meters squared. Body mass index is 20.37 kg/m.Marland Kitchen  GENERAL: Alert, active, oriented x3  LUNGS: Sound clear with no rales rhonchi or wheezes.  HEART: Regular rhythm S1 and S2 without murmur.  ABDOMEN: Soft and depressible, nontender with no palpable mass, no hepatomegaly.  EXTREMITIES: Well-developed well-nourished symmetrical with no dependent edema.  NEUROLOGICAL: Awake alert oriented, facial expression symmetrical, moving all extremities.   IMPRESSION:  Right upper quadrant pain [R10.11] -Abdominal ultrasound negative for cholelithiasis or cholecystitis -CT scan of the abdomen and pelvis negative for any intra-abdominal pathology -HIDA scan shows gallbladder ejection fraction of 90%. 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 I discussed with the patient that day-to-day showing that patient with right upper quadrant pain with ejection fraction of more than 80% can improve with cholecystectomy. I also discussed with patient that there is a fairly moderate patient that can continue with persistence of pain. Also discussed with patient differential diagnosis of gastritis and peptic ulcer disease. He agrees to proceed with upper endoscopy before jumping into cholecystectomy. Will set him up for upper endoscopy. If upper endoscopy negative we will discuss about robotic assisted laparoscopic cholecystectomy. I discussed with  patient the goal of the surgery. I also discussed with patient the risk of the surgery including bleeding, infection, bile leak, injury to common bile duct or adjacent organs, among others. The patient reports he understood and agreed to proceed. Wife was present during this encounter and agreed with plan  PLAN: 1. We discussed to proceed with upper endoscopy for evaluation of gastritis, gastric ulcers or other pathologies that can explain your upper abdominal pain 2. If negative we will proceed with robotic assisted laparoscopic cholecystectomy 3. Will contact you after upper endoscopy for coordination of cholecystectomy if needed.  Patient verbalized understanding, all questions were answered, and were agreeable with the plan outlined above.  Carolan Shiver, MD

## 2022-11-02 NOTE — Interval H&P Note (Signed)
History and Physical Interval Note:  11/02/2022 7:56 AM  Jeremiah Mueller  has presented today for surgery, with the diagnosis of epigastric pain R10.13.  The various methods of treatment have been discussed with the patient and family. After consideration of risks, benefits and other options for treatment, the patient has consented to  Procedure(s): ESOPHAGOGASTRODUODENOSCOPY (EGD) WITH PROPOFOL (N/A) as a surgical intervention.  The patient's history has been reviewed, patient examined, no change in status, stable for surgery.  I have reviewed the patient's chart and labs.  Questions were answered to the patient's satisfaction.     Simcha Farrington Tonna Boehringer

## 2022-11-03 ENCOUNTER — Encounter: Payer: Self-pay | Admitting: Surgery

## 2022-11-10 ENCOUNTER — Ambulatory Visit: Payer: Self-pay | Admitting: General Surgery

## 2022-11-10 NOTE — H&P (Signed)
HISTORY OF PRESENT ILLNESS:   [name] is a 67 y.o.male patient who comes for evaluation of right upper quadrant pain.  Patient come for evaluation of right upper quadrant pain after workup with CT scan of the abdomen and pelvis and HIDA scan. Patient initially consulted to surgery. Abdominal ultrasound negative for gallbladder pathology. Due to patient right upper current pain I ordered a CT scan of the abdomen which was negative for any intra-abdominal pathology. Then I proceeded to order HIDA scan for evaluation of gallbladder dyskinesia. HIDA scan was positive for gallbladder radiation fraction of 90%. Patient endorses he continues having right upper current pain. Pain is aggravated by eating. Pain radiates to his right upper back. He can identify any alleviating factors.   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 Lina Sar @ [facility] - Adenomatous Polyp, PH Polyps, Poor Prep and rpt immediate per Peery  COLONOSCOPY 11/02/2017  Dr. Christean Leaf @ [facility] - Adenomatous Polyp, rpt 3-5 yrs per provider  1. Reverse left total shoulder arthroplasty. 2. Endoscopic left carpal tunnel release. Left 01/21/2020  Dr. Joice Lofts  1. Reverse right total shoulder arthroplasty. 2. Endoscopic right carpal tunnel release. Right 05/12/2020  Dr. Joice Lofts  COLONOSCOPY N/A 05/04/2021  Procedure: COLONOSCOPY; Surgeon: Mauri Reading, MD; Location: [facility]; Service: Gastroenterology; Laterality: N/A;  APPENDECTOMY    MEDICATIONS:  Outpatient Encounter Medications as of 10/18/2022  Medication Sig Dispense Refill  blood glucose diagnostic test strip Use 1 strip 3  (three) times daily Use as instructed.  blood glucose meter kit Use as directed  cholecalciferol 1000 unit tablet Take by mouth  co-enzyme Q-10, ubiquinone, (CO Q-10) 100 mg capsule Take 100 mg by mouth once daily  losartan (COZAAR) 25 MG tablet Take 1 tablet (25 mg total) by mouth once daily 90 tablet 3  metFORMIN (GLUCOPHAGE) 1000 MG tablet Take 1 tablet (1,000 mg total) by mouth 2 (two) times daily with meals 182 tablet 3  multivitamin tablet Take 1 tablet by mouth once daily  rosuvastatin (CRESTOR) 20 MG tablet Take 1 tablet (20 mg total) by mouth once daily 91 tablet 3  ZINC ORAL Take by mouth  blood glucose diagnostic (GLUCOSE BLOOD) test strip 1 each (1 strip total) 2 (two) times daily Use as instructed. 200 each 11  blood glucose meter kit as directed 1 each 0  lancing device with lancets kit Use 1 each 2 (two) times daily Use as instructed. 200 each 11   No facility-administered encounter medications on file as of 10/18/2022.    ALLERGIES:  Patient has no known allergies.  SOCIAL HISTORY:  Social History   Socioeconomic History  Marital status: Divorced  Tobacco Use  Smoking status: Every Day  Types: Cigars  Smokeless tobacco: Never  Tobacco comments:  Few cigars a day  Vaping Use  Vaping status: Never Used  Substance and Sexual Activity  Alcohol use: Yes  Alcohol/week: 5.0 standard drinks of alcohol  Types: 5 Shots of liquor per week  Drug use: No  Sexual activity: Defer   FAMILY HISTORY:  Family History  Problem Relation Name Age of Onset  Kidney disease Mother  Kidney disease Father  Diabetes  Father  High blood pressure (Hypertension) Father  Breast cancer Sister  Colon cancer Brother    GENERAL REVIEW OF SYSTEMS:   General ROS: negative for - chills, fatigue, fever, weight gain or weight loss Allergy and Immunology ROS: negative for - hives  Hematological and Lymphatic ROS: negative for - bleeding problems or bruising, negative for palpable  nodes Endocrine ROS: negative for - heat or cold intolerance, hair changes Respiratory ROS: negative for - cough, shortness of breath or wheezing Cardiovascular ROS: no chest pain or palpitations GI ROS: negative for nausea, vomiting, diarrhea, constipation. Positive for abdominal pain Musculoskeletal ROS: negative for - joint swelling or muscle pain Neurological ROS: negative for - confusion, syncope Dermatological ROS: negative for pruritus and rash  PHYSICAL EXAM:  Vitals:  10/18/22 1334  BP: 104/57  Pulse: 65  .  Ht:172.7 cm (5\' 8" ) Wt:60.8 kg (134 lb) LKG:MWNU surface area is 1.71 meters squared. Body mass index is 20.37 kg/m.Marland Kitchen  GENERAL: Alert, active, oriented x3  LUNGS: Sound clear with no rales rhonchi or wheezes.  HEART: Regular rhythm S1 and S2 without murmur.  ABDOMEN: Soft and depressible, nontender with no palpable mass, no hepatomegaly.   EXTREMITIES: Well-developed well-nourished symmetrical with no dependent edema.  NEUROLOGICAL: Awake alert oriented, facial expression symmetrical, moving all extremities.   IMPRESSION:   Right upper quadrant pain [R10.11] -Abdominal ultrasound negative for cholelithiasis or cholecystitis -CT scan of the abdomen and pelvis negative for any intra-abdominal pathology -HIDA scan shows gallbladder ejection fraction of 90%. 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 I discussed with the patient that day-to-day showing that patient with right upper quadrant pain with ejection fraction of more than 80% can improve with cholecystectomy. I also discussed with patient that there is a fairly moderate patient that can continue with persistence of pain. Also discussed with patient differential diagnosis of gastritis and peptic ulcer disease. He agrees to proceed with upper endoscopy before jumping into cholecystectomy. Will set him up for upper endoscopy. If upper endoscopy negative we will discuss  about robotic assisted laparoscopic cholecystectomy. I discussed with patient the goal of the surgery. I also discussed with patient the risk of the surgery including bleeding, infection, bile leak, injury to common bile duct or adjacent organs, among others. The patient reports he understood and agreed to proceed. Wife was present during this encounter and agreed with plan  Patient had upper endoscopy negative for any other pathology. Patient wanted to proceed with cholecystectomy. Patient understand the risks of surgery and its benefit.   PLAN:  1. Robotic assisted laparoscopic cholecystectomy  Patient verbalized understanding, all questions were answered, and were agreeable with the plan outlined above.   Carolan Shiver, MD

## 2022-11-10 NOTE — H&P (View-Only) (Signed)
HISTORY OF PRESENT ILLNESS:   Jeremiah Mueller is a 67 y.o.male patient who comes for evaluation of right upper quadrant pain.  Patient come for evaluation of right upper quadrant pain after workup with CT scan of the abdomen and pelvis and HIDA scan. Patient initially consulted to surgery. Abdominal ultrasound negative for gallbladder pathology. Due to patient right upper current pain I ordered a CT scan of the abdomen which was negative for any intra-abdominal pathology. Then I proceeded to order HIDA scan for evaluation of gallbladder dyskinesia. HIDA scan was positive for gallbladder radiation fraction of 90%. Patient endorses he continues having right upper current pain. Pain is aggravated by eating. Pain radiates to his right upper back. He can identify any alleviating factors.   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 A. Peery @ [facility] - Adenomatous Polyp, PH Polyps, Poor Prep and rpt immediate per Peery  COLONOSCOPY 11/02/2017  Dr. J. Tang @ [facility] - Adenomatous Polyp, rpt 3-5 yrs per provider  1. Reverse left total shoulder arthroplasty. 2. Endoscopic left carpal tunnel release. Left 01/21/2020  Dr. Poggi  1. Reverse right total shoulder arthroplasty. 2. Endoscopic right carpal tunnel release. Right 05/12/2020  Dr. Poggi  COLONOSCOPY N/A 05/04/2021  Procedure: COLONOSCOPY; Surgeon: Dinani, Amreen, MD; Location: [facility]; Service: Gastroenterology; Laterality: N/A;  APPENDECTOMY    MEDICATIONS:  Outpatient Encounter Medications as of 10/18/2022  Medication Sig Dispense Refill  blood glucose diagnostic test strip Use 1 strip 3  (three) times daily Use as instructed.  blood glucose meter kit Use as directed  cholecalciferol 1000 unit tablet Take by mouth  co-enzyme Q-10, ubiquinone, (CO Q-10) 100 mg capsule Take 100 mg by mouth once daily  losartan (COZAAR) 25 MG tablet Take 1 tablet (25 mg total) by mouth once daily 90 tablet 3  metFORMIN (GLUCOPHAGE) 1000 MG tablet Take 1 tablet (1,000 mg total) by mouth 2 (two) times daily with meals 182 tablet 3  multivitamin tablet Take 1 tablet by mouth once daily  rosuvastatin (CRESTOR) 20 MG tablet Take 1 tablet (20 mg total) by mouth once daily 91 tablet 3  ZINC ORAL Take by mouth  blood glucose diagnostic (GLUCOSE BLOOD) test strip 1 each (1 strip total) 2 (two) times daily Use as instructed. 200 each 11  blood glucose meter kit as directed 1 each 0  lancing device with lancets kit Use 1 each 2 (two) times daily Use as instructed. 200 each 11   No facility-administered encounter medications on file as of 10/18/2022.    ALLERGIES:  Patient has no known allergies.  SOCIAL HISTORY:  Social History   Socioeconomic History  Marital status: Divorced  Tobacco Use  Smoking status: Every Day  Types: Cigars  Smokeless tobacco: Never  Tobacco comments:  Few cigars a day  Vaping Use  Vaping status: Never Used  Substance and Sexual Activity  Alcohol use: Yes  Alcohol/week: 5.0 standard drinks of alcohol  Types: 5 Shots of liquor per week  Drug use: No  Sexual activity: Defer   FAMILY HISTORY:  Family History  Problem Relation Name Age of Onset  Kidney disease Mother  Kidney disease Father  Diabetes   Father  High blood pressure (Hypertension) Father  Breast cancer Sister  Colon cancer Brother    GENERAL REVIEW OF SYSTEMS:   General ROS: negative for - chills, fatigue, fever, weight gain or weight loss Allergy and Immunology ROS: negative for - hives  Hematological and Lymphatic ROS: negative for - bleeding problems or bruising, negative for palpable  nodes Endocrine ROS: negative for - heat or cold intolerance, hair changes Respiratory ROS: negative for - cough, shortness of breath or wheezing Cardiovascular ROS: no chest pain or palpitations GI ROS: negative for nausea, vomiting, diarrhea, constipation. Positive for abdominal pain Musculoskeletal ROS: negative for - joint swelling or muscle pain Neurological ROS: negative for - confusion, syncope Dermatological ROS: negative for pruritus and rash  PHYSICAL EXAM:  Vitals:  10/18/22 1334  BP: 104/57  Pulse: 65  .  Ht:172.7 cm (5' 8") Wt:60.8 kg (134 lb) BSA:Body surface area is 1.71 meters squared. Body mass index is 20.37 kg/m..  GENERAL: Alert, active, oriented x3  LUNGS: Sound clear with no rales rhonchi or wheezes.  HEART: Regular rhythm S1 and S2 without murmur.  ABDOMEN: Soft and depressible, nontender with no palpable mass, no hepatomegaly.   EXTREMITIES: Well-developed well-nourished symmetrical with no dependent edema.  NEUROLOGICAL: Awake alert oriented, facial expression symmetrical, moving all extremities.   IMPRESSION:   Right upper quadrant pain [R10.11] -Abdominal ultrasound negative for cholelithiasis or cholecystitis -CT scan of the abdomen and pelvis negative for any intra-abdominal pathology -HIDA scan shows gallbladder ejection fraction of 90%. 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 I discussed with the patient that day-to-day showing that patient with right upper quadrant pain with ejection fraction of more than 80% can improve with cholecystectomy. I also discussed with patient that there is a fairly moderate patient that can continue with persistence of pain. Also discussed with patient differential diagnosis of gastritis and peptic ulcer disease. He agrees to proceed with upper endoscopy before jumping into cholecystectomy. Will set him up for upper endoscopy. If upper endoscopy negative we will discuss  about robotic assisted laparoscopic cholecystectomy. I discussed with patient the goal of the surgery. I also discussed with patient the risk of the surgery including bleeding, infection, bile leak, injury to common bile duct or adjacent organs, among others. The patient reports he understood and agreed to proceed. Wife was present during this encounter and agreed with plan  Patient had upper endoscopy negative for any other pathology. Patient wanted to proceed with cholecystectomy. Patient understand the risks of surgery and its benefit.   PLAN:  1. Robotic assisted laparoscopic cholecystectomy  Patient verbalized understanding, all questions were answered, and were agreeable with the plan outlined above.   Santana Edell Cintron-Diaz, MD   

## 2022-11-14 ENCOUNTER — Encounter
Admission: RE | Admit: 2022-11-14 | Discharge: 2022-11-14 | Disposition: A | Discharge status: Home / Self Care | Payer: Medicare Other | Source: Ambulatory Visit | Attending: General Surgery | Admitting: General Surgery

## 2022-11-14 ENCOUNTER — Other Ambulatory Visit: Payer: Self-pay

## 2022-11-14 DIAGNOSIS — I10 Essential (primary) hypertension: Secondary | ICD-10-CM

## 2022-11-14 DIAGNOSIS — E119 Type 2 diabetes mellitus without complications: Secondary | ICD-10-CM

## 2022-11-14 HISTORY — DX: Personal history of urinary calculi: Z87.442

## 2022-11-14 HISTORY — DX: Other specified diseases of gallbladder: K82.8

## 2022-11-14 NOTE — Patient Instructions (Addendum)
Your procedure is scheduled on: 11/21/22 - Monday Report to the Registration Desk on the 1st floor of the [facility]. To find out your arrival time, please call [phone] between 1PM - 3PM on: 11/18/22 - Friday If your arrival time is 6:00 am, do not arrive before that time as the [facility] entrance doors do not open until 6:00 am.  REMEMBER: Instructions that are not followed completely may result in serious medical risk, up to and including death; or upon the discretion of your surgeon and anesthesiologist your surgery may need to be rescheduled.  Do not eat food or drink any liquids after midnight the night before surgery.  No gum chewing or hard candies.   One week prior to surgery: Stop Anti-inflammatories (NSAIDS) such as Advil, Aleve, Ibuprofen, Motrin, Naproxen, Naprosyn and Aspirin based products such as Excedrin, Goody's Powder, BC Powder.  Stop taking beginning 11/14/22, ANY OVER THE COUNTER supplements until after surgery   You may take Tylenol if needed for pain up until the day of surgery.  Continue taking all prescribed medications with the exception of the following:  - metFORMIN (GLUCOPHAGE) hold beginning 11/19/22, may resume taking after surgery.   TAKE ONLY THESE MEDICATIONS THE MORNING OF SURGERY WITH A SIP OF WATER:  rosuvastatin (CRESTOR)   No Alcohol for 24 hours before or after surgery.  No Smoking including e-cigarettes for 24 hours before surgery.  No chewable tobacco products for at least 6 hours before surgery.  No nicotine patches on the day of surgery.  Do not use any "recreational" drugs for at least a week (preferably 2 weeks) before your surgery.  Please be advised that the combination of cocaine and anesthesia may have negative outcomes, up to and including death. If you test positive for cocaine, your surgery will be cancelled.  On the morning of surgery brush your teeth with toothpaste and water, you may rinse your mouth with  mouthwash if you wish. Do not swallow any toothpaste or mouthwash.  Use CHG Soap or wipes as directed on instruction sheet.  Do not wear jewelry, make-up, hairpins, clips or nail polish.  Do not wear lotions, powders, or perfumes.   Do not shave body hair from the neck down 48 hours before surgery.  Contact lenses, hearing aids and dentures may not be worn into surgery.  Do not bring valuables to the hospital. [facility] is not responsible for any missing/lost belongings or valuables.   Notify your doctor if there is any change in your medical condition (cold, fever, infection).  Wear comfortable clothing (specific to your surgery type) to the hospital.  After surgery, you can help prevent lung complications by doing breathing exercises.  Take deep breaths and cough every 1-2 hours. Your doctor may order a device called an Incentive Spirometer to help you take deep breaths. When coughing or sneezing, hold a pillow firmly against your incision with both hands. This is called "splinting." Doing this helps protect your incision. It also decreases belly discomfort.  If you are being admitted to the hospital overnight, leave your suitcase in the car. After surgery it may be brought to your room.  In case of increased patient census, it may be necessary for you, the patient, to continue your postoperative care in the Same Day Surgery department.  If you are being discharged the day of surgery, you will not be allowed to drive home. You will need a responsible individual to drive you home and stay with you for  24 hours after surgery.   If you are taking public transportation, you will need to have a responsible individual with you.  Please call the Pre-admissions Testing Dept. at [phone] if you have any questions about these instructions.  Surgery Visitation Policy:  Patients having surgery or a procedure may have two visitors.  Children under the age of 24 must have an adult  with them who is not the patient.  Inpatient Visitation:    Visiting hours are 7 a.m. to 8 p.m. Up to four visitors are allowed at one time in a patient room. The visitors may rotate out with other people during the day.  One visitor age 62 or older may stay with the patient overnight and must be in the room by 8 p.m.    Preparing for Surgery with CHLORHEXIDINE GLUCONATE (CHG) Soap  Chlorhexidine Gluconate (CHG) Soap  o An antiseptic cleaner that kills germs and bonds with the skin to continue killing germs even after washing  o Used for showering the night before surgery and morning of surgery  Before surgery, you can play an important role by reducing the number of germs on your skin.  CHG (Chlorhexidine gluconate) soap is an antiseptic cleanser which kills germs and bonds with the skin to continue killing germs even after washing.  Please do not use if you have an allergy to CHG or antibacterial soaps. If your skin becomes reddened/irritated stop using the CHG.  1. Shower the NIGHT BEFORE SURGERY and the MORNING OF SURGERY with CHG soap.  2. If you choose to wash your hair, wash your hair first as usual with your normal shampoo.  3. After shampooing, rinse your hair and body thoroughly to remove the shampoo.  4. Use CHG as you would any other liquid soap. You can apply CHG directly to the skin and wash gently with a scrungie or a clean washcloth.  5. Apply the CHG soap to your body only from the neck down. Do not use on open wounds or open sores. Avoid contact with your eyes, ears, mouth, and genitals (private parts). Wash face and genitals (private parts) with your normal soap.  6. Wash thoroughly, paying special attention to the area where your surgery will be performed.  7. Thoroughly rinse your body with warm water.  8. Do not shower/wash with your normal soap after using and rinsing off the CHG soap.  9. Pat yourself dry with a clean towel.  10. Wear clean pajamas to  bed the night before surgery.  12. Place clean sheets on your bed the night of your first shower and do not sleep with pets.  13. Shower again with the CHG soap on the day of surgery prior to arriving at the hospital.  14. Do not apply any deodorants/lotions/powders.  15. Please wear clean clothes to the hospital.

## 2022-11-15 ENCOUNTER — Encounter
Admission: RE | Admit: 2022-11-15 | Discharge: 2022-11-15 | Disposition: A | Discharge status: Home / Self Care | Payer: Medicare Other | Source: Ambulatory Visit | Attending: General Surgery | Admitting: General Surgery

## 2022-11-15 DIAGNOSIS — Z01810 Encounter for preprocedural cardiovascular examination: Secondary | ICD-10-CM | POA: Diagnosis not present

## 2022-11-15 DIAGNOSIS — I10 Essential (primary) hypertension: Secondary | ICD-10-CM | POA: Diagnosis not present

## 2022-11-20 MED ORDER — SODIUM CHLORIDE 0.9 % IV SOLN: INTRAVENOUS | Status: DC

## 2022-11-20 MED ORDER — CEFAZOLIN SODIUM-DEXTROSE 2-4 GM/100ML-% IV SOLN
2.0000 g | INTRAVENOUS | Status: AC
  Administered 2022-11-21: 2 g via INTRAVENOUS

## 2022-11-20 MED ORDER — CHLORHEXIDINE GLUCONATE 0.12 % MT SOLN
15.0000 mL | Freq: Once | OROMUCOSAL | Status: AC
  Administered 2022-11-21: 15 mL via OROMUCOSAL

## 2022-11-20 MED ORDER — ORAL CARE MOUTH RINSE: 15.0000 mL | Freq: Once | OROMUCOSAL | Status: AC

## 2022-11-20 MED ORDER — INDOCYANINE GREEN 25 MG IV SOLR
1.2500 mg | Freq: Once | INTRAVENOUS | Status: AC
  Administered 2022-11-21: 1.25 mg via INTRAVENOUS

## 2022-11-21 ENCOUNTER — Other Ambulatory Visit: Payer: Self-pay

## 2022-11-21 ENCOUNTER — Ambulatory Visit: Payer: Medicare Other | Admitting: Anesthesiology

## 2022-11-21 ENCOUNTER — Encounter
Admission: RE | Disposition: A | Discharge status: Home / Self Care | Payer: Self-pay | Source: Home / Self Care | Attending: General Surgery

## 2022-11-21 ENCOUNTER — Encounter: Payer: Self-pay | Admitting: General Surgery

## 2022-11-21 ENCOUNTER — Ambulatory Visit
Admission: RE | Admit: 2022-11-21 | Discharge: 2022-11-21 | Disposition: A | Discharge status: Home / Self Care | Payer: Medicare Other | Attending: General Surgery | Admitting: General Surgery

## 2022-11-21 DIAGNOSIS — J449 Chronic obstructive pulmonary disease, unspecified: Secondary | ICD-10-CM | POA: Insufficient documentation

## 2022-11-21 DIAGNOSIS — F17290 Nicotine dependence, other tobacco product, uncomplicated: Secondary | ICD-10-CM | POA: Diagnosis not present

## 2022-11-21 DIAGNOSIS — I10 Essential (primary) hypertension: Secondary | ICD-10-CM | POA: Insufficient documentation

## 2022-11-21 DIAGNOSIS — Z7984 Long term (current) use of oral hypoglycemic drugs: Secondary | ICD-10-CM | POA: Insufficient documentation

## 2022-11-21 DIAGNOSIS — K828 Other specified diseases of gallbladder: Secondary | ICD-10-CM | POA: Diagnosis present

## 2022-11-21 DIAGNOSIS — G709 Myoneural disorder, unspecified: Secondary | ICD-10-CM | POA: Insufficient documentation

## 2022-11-21 DIAGNOSIS — K811 Chronic cholecystitis: Secondary | ICD-10-CM | POA: Insufficient documentation

## 2022-11-21 DIAGNOSIS — E119 Type 2 diabetes mellitus without complications: Secondary | ICD-10-CM | POA: Insufficient documentation

## 2022-11-21 LAB — GLUCOSE, CAPILLARY
Glucose-Capillary: 129 mg/dL — ABNORMAL HIGH (ref 70–99)
Glucose-Capillary: 146 mg/dL — ABNORMAL HIGH (ref 70–99)

## 2022-11-21 SURGERY — CHOLECYSTECTOMY, ROBOT-ASSISTED, LAPAROSCOPIC
Anesthesia: General | Site: Abdomen

## 2022-11-21 MED ORDER — ROCURONIUM BROMIDE 100 MG/10ML IV SOLN
INTRAVENOUS | Status: DC | PRN
  Administered 2022-11-21: 50 mg via INTRAVENOUS

## 2022-11-21 MED ORDER — FENTANYL CITRATE (PF) 100 MCG/2ML IJ SOLN
INTRAMUSCULAR | Status: AC
  Filled 2022-11-21: qty 2

## 2022-11-21 MED ORDER — ESMOLOL HCL 100 MG/10ML IV SOLN
INTRAVENOUS | Status: DC | PRN
  Administered 2022-11-21: 20 mg via INTRAVENOUS

## 2022-11-21 MED ORDER — CEFAZOLIN SODIUM-DEXTROSE 2-4 GM/100ML-% IV SOLN
INTRAVENOUS | Status: AC
  Filled 2022-11-21: qty 100

## 2022-11-21 MED ORDER — BUPIVACAINE-EPINEPHRINE 0.25% -1:200000 IJ SOLN
INTRAMUSCULAR | Status: DC | PRN
  Administered 2022-11-21: 30 mL

## 2022-11-21 MED ORDER — BUPIVACAINE HCL (PF) 0.25 % IJ SOLN
INTRAMUSCULAR | Status: AC
  Filled 2022-11-21: qty 30

## 2022-11-21 MED ORDER — PHENYLEPHRINE 80 MCG/ML (10ML) SYRINGE FOR IV PUSH (FOR BLOOD PRESSURE SUPPORT)
PREFILLED_SYRINGE | INTRAVENOUS | Status: DC | PRN
  Administered 2022-11-21: 80 ug via INTRAVENOUS

## 2022-11-21 MED ORDER — OXYCODONE HCL 5 MG PO TABS
ORAL_TABLET | ORAL | Status: AC
  Filled 2022-11-21: qty 1

## 2022-11-21 MED ORDER — SUGAMMADEX SODIUM 200 MG/2ML IV SOLN
INTRAVENOUS | Status: DC | PRN
  Administered 2022-11-21: 200 mg via INTRAVENOUS

## 2022-11-21 MED ORDER — OXYCODONE HCL 5 MG PO TABS
5.0000 mg | ORAL_TABLET | Freq: Once | ORAL | Status: AC | PRN
  Administered 2022-11-21: 5 mg via ORAL

## 2022-11-21 MED ORDER — ONDANSETRON HCL 4 MG/2ML IJ SOLN
INTRAMUSCULAR | Status: DC | PRN
  Administered 2022-11-21 (×2): 4 mg via INTRAVENOUS

## 2022-11-21 MED ORDER — FENTANYL CITRATE (PF) 100 MCG/2ML IJ SOLN
25.0000 ug | INTRAMUSCULAR | Status: DC | PRN
  Administered 2022-11-21: 25 ug via INTRAVENOUS
  Administered 2022-11-21 (×2): 50 ug via INTRAVENOUS
  Administered 2022-11-21 (×2): 25 ug via INTRAVENOUS

## 2022-11-21 MED ORDER — MIDAZOLAM HCL 2 MG/2ML IJ SOLN
INTRAMUSCULAR | Status: AC
  Filled 2022-11-21: qty 2

## 2022-11-21 MED ORDER — MIDAZOLAM HCL 2 MG/2ML IJ SOLN
INTRAMUSCULAR | Status: DC | PRN
  Administered 2022-11-21: 2 mg via INTRAVENOUS

## 2022-11-21 MED ORDER — PHENYLEPHRINE HCL-NACL 20-0.9 MG/250ML-% IV SOLN
INTRAVENOUS | Status: AC
  Filled 2022-11-21: qty 250

## 2022-11-21 MED ORDER — HYDROCODONE-ACETAMINOPHEN 5-325 MG PO TABS: 1.0000 | ORAL_TABLET | ORAL | 0 refills | Status: AC | PRN

## 2022-11-21 MED ORDER — DEXAMETHASONE SODIUM PHOSPHATE 10 MG/ML IJ SOLN
INTRAMUSCULAR | Status: DC | PRN
  Administered 2022-11-21: 5 mg via INTRAVENOUS

## 2022-11-21 MED ORDER — INDOCYANINE GREEN 25 MG IV SOLR
INTRAVENOUS | Status: AC
  Filled 2022-11-21: qty 10

## 2022-11-21 MED ORDER — GLYCOPYRROLATE 0.2 MG/ML IJ SOLN
INTRAMUSCULAR | Status: DC | PRN
  Administered 2022-11-21: .2 mg via INTRAVENOUS

## 2022-11-21 MED ORDER — PROPOFOL 1000 MG/100ML IV EMUL
INTRAVENOUS | Status: AC
  Filled 2022-11-21: qty 100

## 2022-11-21 MED ORDER — PROPOFOL 10 MG/ML IV BOLUS
INTRAVENOUS | Status: DC | PRN
  Administered 2022-11-21: 200 mg via INTRAVENOUS

## 2022-11-21 MED ORDER — FENTANYL CITRATE (PF) 100 MCG/2ML IJ SOLN
INTRAMUSCULAR | Status: DC | PRN
  Administered 2022-11-21 (×3): 50 ug via INTRAVENOUS

## 2022-11-21 MED ORDER — OXYCODONE HCL 5 MG/5ML PO SOLN: 5.0000 mg | Freq: Once | ORAL | Status: AC | PRN

## 2022-11-21 MED ORDER — CHLORHEXIDINE GLUCONATE 0.12 % MT SOLN
OROMUCOSAL | Status: AC
  Filled 2022-11-21: qty 15

## 2022-11-21 MED ORDER — ACETAMINOPHEN 10 MG/ML IV SOLN
INTRAVENOUS | Status: DC | PRN
  Administered 2022-11-21: 1000 mg via INTRAVENOUS

## 2022-11-21 MED ORDER — SEVOFLURANE IN SOLN
RESPIRATORY_TRACT | Status: AC
  Filled 2022-11-21: qty 250

## 2022-11-21 MED ORDER — EPINEPHRINE PF 1 MG/ML IJ SOLN
INTRAMUSCULAR | Status: AC
  Filled 2022-11-21: qty 1

## 2022-11-21 MED ORDER — LIDOCAINE HCL (CARDIAC) PF 100 MG/5ML IV SOSY
PREFILLED_SYRINGE | INTRAVENOUS | Status: DC | PRN
  Administered 2022-11-21: 100 mg via INTRAVENOUS

## 2022-11-21 SURGICAL SUPPLY — 52 items
ADH SKN CLS APL DERMABOND .7 (GAUZE/BANDAGES/DRESSINGS) ×2
BAG PRESSURE INF REUSE 1000 (BAG) IMPLANT
BLADE SURG SZ11 CARB STEEL (BLADE) ×2 IMPLANT
CANNULA REDUCER 12-8 DVNC XI (CANNULA) ×2 IMPLANT
CATH REDDICK CHOLANGI 4FR 50CM (CATHETERS) IMPLANT
CAUTERY HOOK MNPLR 1.6 DVNC XI (INSTRUMENTS) ×2 IMPLANT
CLIP LIGATING HEM O LOK PURPLE (MISCELLANEOUS) IMPLANT
CLIP LIGATING HEMO O LOK GREEN (MISCELLANEOUS) ×2 IMPLANT
DERMABOND ADVANCED .7 DNX12 (GAUZE/BANDAGES/DRESSINGS) ×2 IMPLANT
DRAPE ARM DVNC X/XI (DISPOSABLE) ×8 IMPLANT
DRAPE C-ARM XRAY 36X54 (DRAPES) IMPLANT
DRAPE COLUMN DVNC XI (DISPOSABLE) ×2 IMPLANT
ELECT REM PT RETURN 9FT ADLT (ELECTROSURGICAL) ×2
ELECTRODE REM PT RTRN 9FT ADLT (ELECTROSURGICAL) ×2 IMPLANT
FORCEPS BPLR 8 MD DVNC XI (FORCEP) ×2 IMPLANT
FORCEPS BPLR R/ABLATION 8 DVNC (INSTRUMENTS) ×2 IMPLANT
FORCEPS PROGRASP DVNC XI (FORCEP) ×2 IMPLANT
GLOVE BIO SURGEON STRL SZ 6.5 (GLOVE) ×4 IMPLANT
GLOVE BIOGEL PI IND STRL 6.5 (GLOVE) ×4 IMPLANT
GOWN STRL REUS W/ TWL LRG LVL3 (GOWN DISPOSABLE) ×6 IMPLANT
GOWN STRL REUS W/TWL LRG LVL3 (GOWN DISPOSABLE) ×6
GRASPER SUT TROCAR 14GX15 (MISCELLANEOUS) ×2 IMPLANT
IRRIGATOR SUCT 8 DISP DVNC XI (IRRIGATION / IRRIGATOR) IMPLANT
IV CATH ANGIO 12GX3 LT BLUE (NEEDLE) IMPLANT
IV NS 1000ML (IV SOLUTION)
IV NS 1000ML BAXH (IV SOLUTION) IMPLANT
KIT PINK PAD W/HEAD ARE REST (MISCELLANEOUS) ×2
KIT PINK PAD W/HEAD ARM REST (MISCELLANEOUS) ×2 IMPLANT
LABEL OR SOLS (LABEL) ×2 IMPLANT
MANIFOLD NEPTUNE II (INSTRUMENTS) ×2 IMPLANT
NDL HYPO 22X1.5 SAFETY MO (MISCELLANEOUS) ×2 IMPLANT
NDL INSUFFLATION 14GA 120MM (NEEDLE) ×2 IMPLANT
NEEDLE HYPO 22X1.5 SAFETY MO (MISCELLANEOUS) ×2 IMPLANT
NEEDLE INSUFFLATION 14GA 120MM (NEEDLE) ×2 IMPLANT
NS IRRIG 500ML POUR BTL (IV SOLUTION) ×2 IMPLANT
OBTURATOR OPTICAL STND 8 DVNC (TROCAR) ×2
OBTURATOR OPTICALSTD 8 DVNC (TROCAR) ×2 IMPLANT
PACK LAP CHOLECYSTECTOMY (MISCELLANEOUS) ×2 IMPLANT
SEAL UNIV 5-12 XI (MISCELLANEOUS) ×8 IMPLANT
SET TUBE SMOKE EVAC HIGH FLOW (TUBING) ×2 IMPLANT
SOL ELECTROSURG ANTI STICK (MISCELLANEOUS) ×2
SOLUTION ELECTROSURG ANTI STCK (MISCELLANEOUS) ×2 IMPLANT
SPIKE FLUID TRANSFER (MISCELLANEOUS) ×4 IMPLANT
SPONGE T-LAP 4X18 ~~LOC~~+RFID (SPONGE) IMPLANT
SUT MNCRL 4-0 (SUTURE) ×2
SUT MNCRL 4-0 27XMFL (SUTURE) ×2
SUT VICRYL 0 UR6 27IN ABS (SUTURE) ×2 IMPLANT
SUTURE MNCRL 4-0 27XMF (SUTURE) ×2 IMPLANT
SYS BAG RETRIEVAL 10MM (BASKET) ×2
SYSTEM BAG RETRIEVAL 10MM (BASKET) ×2 IMPLANT
TRAP FLUID SMOKE EVACUATOR (MISCELLANEOUS) ×2 IMPLANT
WATER STERILE IRR 500ML POUR (IV SOLUTION) ×2 IMPLANT

## 2022-11-21 NOTE — Op Note (Signed)
Preoperative diagnosis: Biliary dyskinesia  Postoperative diagnosis: Biliary dyskinesia  Procedure: Robotic Assisted Laparoscopic Cholecystectomy.   Anesthesia: GETA   Surgeon: Dr. Hazle Quant  Wound Classification: Clean Contaminated  Indications: Patient is a 67 y.o. male developed right upper quadrant pain, nausea and on workup was found to have HIDA scan with ejection fraction of 90%. Upper endoscopy, CT scan negative for other pathologies. Robotic Assisted Laparoscopic cholecystectomy was elected.  Findings:  Critical view of safety achieved Cystic duct and artery identified, ligated and divided Adequate hemostasis  Description of procedure: The patient was placed on the operating table in the supine position. General anesthesia was induced. A time-out was completed verifying correct patient, procedure, site, positioning, and implant(s) and/or special equipment prior to beginning this procedure. An orogastric tube was placed. The abdomen was prepped and draped in the usual sterile fashion.  An incision was made in a natural skin line below the umbilicus.  The fascia was elevated and the Veress needle inserted. Proper position was confirmed by aspiration and saline meniscus test.  The abdomen was insufflated with carbon dioxide to a pressure of 15 mmHg. The patient tolerated insufflation well. A 8-mm trocar was then inserted in optiview fashion.  The laparoscope was inserted and the abdomen inspected. No injuries from initial trocar placement were noted. Additional trocars were then inserted in the following locations: an 8-mm trocar in the left lateral abdomen, and another two 8-mm trocars to the right side of the abdomen 5 cm appart. The umbilical trocar was changed to a 12 mm trocar all under direct visualization. The abdomen was inspected and no abnormalities were found. The table was placed in the reverse Trendelenburg position with the right side up. The robotic arms were docked  and target anatomy identified. Instrument inserted under direct visualization.  Filmy adhesions between the gallbladder and omentum, duodenum and transverse colon were lysed with electrocautery. The dome of the gallbladder was grasped with a prograsp and retracted over the dome of the liver. The infundibulum was also grasped with an atraumatic grasper and retracted toward the right lower quadrant. This maneuver exposed Calot's triangle. The peritoneum overlying the gallbladder infundibulum was then incised and the cystic duct and cystic artery identified and circumferentially dissected. Critical view of safety reviewed before ligating any structure. Firefly images taken to visualize biliary ducts. The cystic duct and cystic artery were then doubly clipped and divided close to the gallbladder.  The gallbladder was then dissected from its peritoneal attachments by electrocautery. Hemostasis was checked and the gallbladder was removed using an endoscopic retrieval bag. The gallbladder was passed off the table as a specimen. There was no evidence of bleeding from the gallbladder fossa or cystic artery or leakage of the bile from the cystic duct stump. Secondary trocars were removed under direct vision. No bleeding was noted. The robotic arms were undoked. The scope was withdrawn and the umbilical trocar removed. The abdomen was allowed to collapse. The fascia of the 12mm trocar sites was closed with figure-of-eight 0 vicryl sutures. The skin was closed with subcuticular sutures of 4-0 monocryl and topical skin adhesive. The orogastric tube was removed.  The patient tolerated the procedure well and was taken to the postanesthesia care unit in stable condition.   Specimen: Gallbladder  Complications: None  EBL: 5 mL

## 2022-11-21 NOTE — Discharge Instructions (Addendum)
  Diet: Resume home heart healthy regular diet.   Activity: No heavy lifting >20 pounds (children, pets, laundry, garbage) or strenuous activity until follow-up, but light activity and walking are encouraged. Do not drive or drink alcohol if taking narcotic pain medications.  Wound care: May shower with soapy water and pat dry (do not rub incisions), but no baths or submerging incision underwater until follow-up. (no swimming)   Medications: Resume all home medications. For mild to moderate pain: acetaminophen (Tylenol) ***or ibuprofen (if no kidney disease). Combining Tylenol with alcohol can substantially increase your risk of causing liver disease. Narcotic pain medications, if prescribed, can be used for severe pain, though may cause nausea, constipation, and drowsiness. Do not combine Tylenol and Norco within a 6 hour period as Norco contains Tylenol. If you do not need the narcotic pain medication, you do not need to fill the prescription.  Call office ([phone]) at any time if any questions, worsening pain, fevers/chills, bleeding, drainage from incision site, or other concerns.   AMBULATORY SURGERY  DISCHARGE INSTRUCTIONS   The drugs that you were given will stay in your system until tomorrow so for the next 24 hours you should not:  Drive an automobile Make any legal decisions Drink any alcoholic beverage   You may resume regular meals tomorrow.  Today it is better to start with liquids and gradually work up to solid foods.  You may eat anything you prefer, but it is better to start with liquids, then soup and crackers, and gradually work up to solid foods.   Please notify your doctor immediately if you have any unusual bleeding, trouble breathing, redness and pain at the surgery site, drainage, fever, or pain not relieved by medication.    Additional Instructions:        Please contact your physician with any problems or Same Day Surgery at [phone], Monday  through Friday 6 am to 4 pm, or [place] [facility] [place] [facility] number at [phone].  

## 2022-11-21 NOTE — Transfer of Care (Signed)
Immediate Anesthesia Transfer of Care Note  Patient: Jeremiah Mueller  Procedure(s) Performed: XI ROBOTIC ASSISTED LAPAROSCOPIC CHOLECYSTECTOMY (Abdomen) INDOCYANINE GREEN FLUORESCENCE IMAGING (ICG)  Patient Location: PACU  Anesthesia Type:General  Level of Consciousness: awake, oriented, and patient cooperative  Airway & Oxygen Therapy: Patient Spontanous Breathing and Patient connected to face mask oxygen  Post-op Assessment: Report given to RN and Post -op Vital signs reviewed and stable  Post vital signs: Reviewed and stable  Last Vitals:  Vitals Value Taken Time  BP 157/81 11/21/22 0839  Temp 36.3 C 11/21/22 0839  Pulse 69 11/21/22 0842  Resp 18 11/21/22 0842  SpO2 99 % 11/21/22 0842  Vitals shown include unvalidated device data.  Last Pain:  Vitals:   11/21/22 0839  TempSrc:   PainSc: 0-No pain         Complications: No notable events documented.

## 2022-11-21 NOTE — Anesthesia Postprocedure Evaluation (Signed)
Anesthesia Post Note  Patient: Jeremiah Mueller  Procedure(s) Performed: XI ROBOTIC ASSISTED LAPAROSCOPIC CHOLECYSTECTOMY (Abdomen) INDOCYANINE GREEN FLUORESCENCE IMAGING (ICG)  Patient location during evaluation: PACU Anesthesia Type: General Level of consciousness: awake and alert Pain management: pain level controlled Vital Signs Assessment: post-procedure vital signs reviewed and stable Respiratory status: spontaneous breathing, nonlabored ventilation, respiratory function stable and patient connected to nasal cannula oxygen Cardiovascular status: blood pressure returned to baseline and stable Postop Assessment: no apparent nausea or vomiting Anesthetic complications: no   No notable events documented.   Last Vitals:  Vitals:   11/21/22 0930 11/21/22 0944  BP: 130/80 122/66  Pulse: 71 60  Resp: 14 15  Temp: 36.8 C 36.8 C  SpO2: 97% 96%    Last Pain:  Vitals:   11/21/22 0944  TempSrc: Temporal  PainSc: 2                  Cleda Mccreedy Mendell Bontempo

## 2022-11-21 NOTE — Anesthesia Procedure Notes (Signed)
Procedure Name: Intubation Date/Time: 11/21/2022 7:35 AM  Performed by: Mohammed Kindle, CRNAPre-anesthesia Checklist: Patient identified, Emergency Drugs available, Suction available and Patient being monitored Patient Re-evaluated:Patient Re-evaluated prior to induction Oxygen Delivery Method: Circle system utilized Preoxygenation: Pre-oxygenation with 100% oxygen Induction Type: IV induction Ventilation: Mask ventilation without difficulty Laryngoscope Size: McGraph and 3 Grade View: Grade I Tube type: Oral Tube size: 7.0 mm Number of attempts: 1 Airway Equipment and Method: Stylet and Oral airway Placement Confirmation: ETT inserted through vocal cords under direct vision, positive ETCO2, breath sounds checked- equal and bilateral and CO2 detector Secured at: 22 cm Tube secured with: Tape Dental Injury: Teeth and Oropharynx as per pre-operative assessment

## 2022-11-21 NOTE — Interval H&P Note (Signed)
History and Physical Interval Note:  11/21/2022 6:34 AM  Jeremiah Mueller  has presented today for surgery, with the diagnosis of K82.8 dyskinesia of gallbladder.  The various methods of treatment have been discussed with the patient and family. After consideration of risks, benefits and other options for treatment, the patient has consented to  Procedure(s): XI ROBOTIC ASSISTED LAPAROSCOPIC CHOLECYSTECTOMY (N/A) INDOCYANINE GREEN FLUORESCENCE IMAGING (ICG) (N/A) as a surgical intervention.  The patient's history has been reviewed, patient examined, no change in status, stable for surgery.  I have reviewed the patient's chart and labs.  Questions were answered to the patient's satisfaction.     Carolan Shiver

## 2022-11-21 NOTE — Anesthesia Preprocedure Evaluation (Signed)
Anesthesia Evaluation  Patient identified by MRN, date of birth, ID band Patient awake    Reviewed: Allergy & Precautions, NPO status , Patient's Chart, lab work & pertinent test results  History of Anesthesia Complications Negative for: history of anesthetic complications  Airway Mallampati: III  TM Distance: >3 FB Neck ROM: full    Dental  (+) Chipped, Poor Dentition, Missing   Pulmonary neg shortness of breath, COPD, Current Smoker and Patient abstained from smoking.   Pulmonary exam normal        Cardiovascular Exercise Tolerance: Good hypertension, (-) angina (-) Past MI and (-) DOE Normal cardiovascular exam     Neuro/Psych  Neuromuscular disease  negative psych ROS   GI/Hepatic negative GI ROS, Neg liver ROS,neg GERD  ,,  Endo/Other  diabetes, Type 2    Renal/GU      Musculoskeletal   Abdominal   Peds  Hematology negative hematology ROS (+)   Anesthesia Other Findings Past Medical History: No date: Carpal tunnel syndrome     Comment:  right   (left corrected) No date: DDD (degenerative disc disease), cervical No date: Diabetes mellitus without complication (HCC) No date: Dyskinesia of gallbladder No date: History of kidney stones No date: Hypercholesterolemia No date: Hypertension  Past Surgical History: No date: APPENDECTOMY 01/21/2020: CARPAL TUNNEL RELEASE; Left     Comment:  Procedure: CARPAL TUNNEL RELEASE ENDOSCOPIC;  Surgeon:               Christena Flake, MD;  Location: [facility] ORS;  Service:               Orthopedics;  Laterality: Left; 05/12/2020: CARPAL TUNNEL RELEASE; Right     Comment:  Procedure: CARPAL TUNNEL RELEASE ENDOSCOPIC;  Surgeon:               Christena Flake, MD;  Location: [facility] ORS;  Service:               Orthopedics;  Laterality: Right; No date: colonscopy 11/02/2022: ESOPHAGOGASTRODUODENOSCOPY (EGD) WITH PROPOFOL; N/A     Comment:  Procedure: ESOPHAGOGASTRODUODENOSCOPY (EGD)  WITH               PROPOFOL;  Surgeon: Sung Amabile, DO;  Location: [facility]               ENDOSCOPY;  Service: General;  Laterality: N/A; 01/21/2020: REVERSE SHOULDER ARTHROPLASTY; Left     Comment:  Procedure: REVERSE SHOULDER ARTHROPLASTY - RNFA;                Surgeon: Christena Flake, MD;  Location: [facility] ORS;                Service: Orthopedics;  Laterality: Left; 05/12/2020: REVERSE SHOULDER ARTHROPLASTY; Right     Comment:  Procedure: REVERSE SHOULDER ARTHROPLASTY;  Surgeon:               Christena Flake, MD;  Location: [facility] ORS;  Service:               Orthopedics;  Laterality: Right;  BMI    Body Mass Index: 21.13 kg/m      Reproductive/Obstetrics negative OB ROS                             Anesthesia Physical Anesthesia Plan  ASA: 3  Anesthesia Plan: General ETT   Post-op Pain Management:    Induction: Intravenous  PONV Risk Score and  Plan: Ondansetron, Dexamethasone, Midazolam and Treatment may vary due to age or medical condition  Airway Management Planned: Oral ETT  Additional Equipment:   Intra-op Plan:   Post-operative Plan: Extubation in OR  Informed Consent: I have reviewed the patients History and Physical, chart, labs and discussed the procedure including the risks, benefits and alternatives for the proposed anesthesia with the patient or authorized representative who has indicated his/her understanding and acceptance.     Dental Advisory Given  Plan Discussed with: Anesthesiologist, CRNA and Surgeon  Anesthesia Plan Comments: (Patient consented for risks of anesthesia including but not limited to:  - adverse reactions to medications - damage to eyes, teeth, lips or other oral mucosa - nerve damage due to positioning  - sore throat or hoarseness - Damage to heart, brain, nerves, lungs, other parts of body or loss of life  Patient voiced understanding.)       Anesthesia Quick Evaluation

## 2024-01-12 ENCOUNTER — Inpatient Hospital Stay

## 2024-01-12 ENCOUNTER — Inpatient Hospital Stay: Attending: Oncology | Admitting: Oncology

## 2024-01-12 ENCOUNTER — Encounter: Payer: Self-pay | Admitting: Oncology

## 2024-01-12 VITALS — BP 140/80 | HR 68 | Temp 97.0°F | Resp 18 | Wt 135.6 lb

## 2024-01-12 DIAGNOSIS — D649 Anemia, unspecified: Secondary | ICD-10-CM

## 2024-01-12 DIAGNOSIS — C9110 Chronic lymphocytic leukemia of B-cell type not having achieved remission: Secondary | ICD-10-CM | POA: Diagnosis not present

## 2024-01-12 NOTE — Progress Notes (Signed)
 [facility]  [phone] Fax:([phone]  ID: Jeremiah Mueller OB: Feb 08, 1956  MR#: [id number]  RDW#:[id number]  Patient Care Team: Fernande Ophelia JINNY DOUGLAS, MD as PCP - General (Internal Medicine)  CHIEF COMPLAINT: CLL,  Rai stage 0.  INTERVAL HISTORY: Patient is a 68 year old male who was noted to have a mild lymphocytosis on routine blood work.  Subsequent flow cytometry confirmed a small population of monoclonal B-cells consistent with underlying CLL.  He is anxious, but otherwise feels well.  He has no neurologic complaints.  He denies any recent fevers or illnesses.  He has a good appetite and denies weight loss.  He has no chest pain, shortness of breath, cough, or hemoptysis.  He denies any nausea, vomiting, constipation, or diarrhea.  He has no urinary complaints.  Patient offers no further specific complaints today.   REVIEW OF SYSTEMS:   Review of Systems  Constitutional: Negative.  Negative for fever, malaise/fatigue and weight loss.  Respiratory: Negative.  Negative for cough, hemoptysis and shortness of breath.   Cardiovascular: Negative.  Negative for chest pain and leg swelling.  Gastrointestinal: Negative.  Negative for abdominal pain.  Genitourinary: Negative.  Negative for dysuria.  Musculoskeletal: Negative.  Negative for back pain.  Skin: Negative.  Negative for rash.  Neurological: Negative.  Negative for dizziness, focal weakness, weakness and headaches.  Psychiatric/Behavioral:  The patient is nervous/anxious.     As per HPI. Otherwise, a complete review of systems is negative.  PAST MEDICAL HISTORY: Past Medical History:  Diagnosis Date   Carpal tunnel syndrome    right   (left corrected)   DDD (degenerative disc disease), cervical    Diabetes mellitus without complication (HCC)    Dyskinesia of gallbladder    History of kidney stones    Hypercholesterolemia    Hypertension     PAST SURGICAL HISTORY: Past Surgical  History:  Procedure Laterality Date   APPENDECTOMY     CARPAL TUNNEL RELEASE Left 01/21/2020   Procedure: CARPAL TUNNEL RELEASE ENDOSCOPIC;  Surgeon: Edie Norleen JINNY, MD;  Location: [facility];  Service: Orthopedics;  Laterality: Left;   CARPAL TUNNEL RELEASE Right 05/12/2020   Procedure: CARPAL TUNNEL RELEASE ENDOSCOPIC;  Surgeon: Edie Norleen JINNY, MD;  Location: [facility];  Service: Orthopedics;  Laterality: Right;   colonscopy     ESOPHAGOGASTRODUODENOSCOPY (EGD) WITH PROPOFOL  N/A 11/02/2022   Procedure: ESOPHAGOGASTRODUODENOSCOPY (EGD) WITH PROPOFOL ;  Surgeon: Tye Millet, DO;  Location: [facility];  Service: General;  Laterality: N/A;   REVERSE SHOULDER ARTHROPLASTY Left 01/21/2020   Procedure: REVERSE SHOULDER ARTHROPLASTY - RNFA;  Surgeon: Edie Norleen JINNY, MD;  Location: [facility];  Service: Orthopedics;  Laterality: Left;   REVERSE SHOULDER ARTHROPLASTY Right 05/12/2020   Procedure: REVERSE SHOULDER ARTHROPLASTY;  Surgeon: Edie Norleen JINNY, MD;  Location: [facility];  Service: Orthopedics;  Laterality: Right;    FAMILY HISTORY: Family History  Problem Relation Age of Onset   Kidney disease Mother    Kidney disease Father    Diabetes Father    Hypertension Father     ADVANCED DIRECTIVES (Y/N):  N  HEALTH MAINTENANCE: Social History   Tobacco Use   Smoking status: Every Day    Types: Cigars   Smokeless tobacco: Never   Tobacco comments:    3-4 cigars daily  Vaping Use   Vaping status: Never Used  Substance Use Topics   Alcohol use: No   Drug use: No     Colonoscopy:  PAP:  Bone density:  Lipid panel:  No Known Allergies  Current Outpatient Medications  Medication Sig Dispense Refill   Blood Glucose Monitoring Suppl (CONTOUR NEXT MONITOR) w/Device KIT      Cholecalciferol 25 MCG (1000 UT) capsule Take 1,000 Units by mouth daily.     Coenzyme Q10 100 MG capsule Take 30 mg by mouth daily.     CONTOUR NEXT TEST test strip      losartan (COZAAR) 25 MG tablet Take 25 mg by  mouth daily.     metFORMIN  (GLUCOPHAGE ) 1000 MG tablet Take 1,000 mg by mouth 2 (two) times daily.     Microlet Lancets MISC      Multiple Vitamins-Minerals (MULTIVITAMIN WITH MINERALS) tablet Take 1 tablet by mouth daily.     rosuvastatin (CRESTOR) 10 MG tablet Take 10 mg by mouth daily.     Zinc Sulfate (ZINC 15 PO) Take 1 tablet by mouth daily.     neomycin-polymyxin b-dexamethasone  (MAXITROL) 3.5-10000-0.1 SUSP Place 1 drop into both eyes every 4 (four) hours as needed. (Patient not taking: Reported on 01/12/2024)     rosuvastatin (CRESTOR) 20 MG tablet Take 20 mg by mouth daily.  (Patient not taking: Reported on 01/12/2024)     No current facility-administered medications for this visit.    OBJECTIVE: Vitals:   01/12/24 0846  BP: (!) 140/80  Pulse: 68  Resp: 18  Temp: (!) 97 F (36.1 C)  SpO2: 100%     Body mass index is 20.62 kg/m.    ECOG FS:0 - Asymptomatic  General: Well-developed, well-nourished, no acute distress. Eyes: Pink conjunctiva, anicteric sclera. HEENT: Normocephalic, moist mucous membranes. Lungs: No audible wheezing or coughing. Heart: Regular rate and rhythm. Abdomen: Soft, nontender, no obvious distention. Musculoskeletal: No edema, cyanosis, or clubbing. Neuro: Alert, answering all questions appropriately. Cranial nerves grossly intact. Skin: No rashes or petechiae noted. Psych: Normal affect. Lymphatics: No cervical, calvicular, axillary or inguinal LAD.   LAB RESULTS:  Lab Results  Component Value Date   NA 137 05/05/2020   K 4.1 05/05/2020   CL 101 05/05/2020   CO2 24 05/05/2020   GLUCOSE 225 (H) 05/05/2020   BUN 16 05/05/2020   CREATININE 0.64 05/05/2020   CALCIUM 9.9 05/05/2020   PROT 7.4 05/05/2020   ALBUMIN 4.5 05/05/2020   AST 22 05/05/2020   ALT 20 05/05/2020   ALKPHOS 58 05/05/2020   BILITOT 1.1 05/05/2020   GFRNONAA >60 05/05/2020   GFRAA >60 01/13/2020    Lab Results  Component Value Date   WBC 8.3 05/05/2020   NEUTROABS  3.9 05/05/2020   HGB 14.7 05/05/2020   HCT 42.7 05/05/2020   MCV 85.9 05/05/2020   PLT 288 05/05/2020     STUDIES: No results found.  ASSESSMENT: CLL, Rai stage 0.  PLAN:    CLL, Rai stage 0: Peripheral blood flow cytometry from December 28, 2023 revealed 2 monoclonal B-cell populations with identical phenotype except for light chain expression.  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  Together, they only comprise less than 1% of leukocytes.  No intervention is needed.  Patient does not require bone marrow biopsy.  Return to clinic in 6 months with repeat laboratory work and further evaluation. Anemia: Patient's most recent hemoglobin was reported at 13.2.  Likely unrelated to underlying CLL.  Monitor.  I spent a total of 45 minutes reviewing chart data, face-to-face evaluation with the patient, counseling and coordination of care as detailed above.   Patient expressed understanding and was  in agreement with this plan. He also understands that He can call clinic at any time with any questions, concerns, or complaints.    Cancer Staging  CLL (chronic lymphocytic leukemia) (HCC) Staging form: Chronic Lymphocytic Leukemia / Small Lymphocytic Lymphoma, AJCC 8th Edition - Clinical stage from 01/12/2024: Modified Rai Stage 0 (Modified Rai risk: Low, Lymphocytosis: Present, Adenopathy: Absent, Organomegaly: Absent, Anemia: Absent, Thrombocytopenia: Absent) - Signed by Jacobo Evalene PARAS, MD on 01/12/2024 Stage prefix: Initial diagnosis   Evalene PARAS Jacobo, MD   01/12/2024 9:31 AM

## 2024-01-12 NOTE — Progress Notes (Signed)
 Patient is doing ok, not having any symptoms as of right now. He is very anxious.

## 2024-01-29 NOTE — Progress Notes (Signed)
 HPI:  Jeremiah Mueller is a 68 y.o. male who presents for evaluation and treatment of bilateral hand pain associated with numbness and paresthesias which developed while chopping wood last week.  After this activity, he noted increased swelling in both hands with stiffness of his fingers and reaggravation of the numbness and paresthesias he was experiencing prior to his bilateral carpal tunnel releases over 4 years ago.  He notes that he was doing well after his carpal tunnel releases until this recent flare-up of symptoms.  He has been taking it easy over the past few days and does note moderate improvement in his symptoms.  However, he still has difficulty making a tight fist.  He does have a history of type II diabetes.  Current Outpatient Medications  Medication Sig Dispense Refill  . amoxicillin (AMOXIL) 500 MG capsule Take 4 tablets 1 hour prior to dental procedure. 4 capsule 3  . aspirin 81 MG EC tablet Take 81 mg by mouth once daily    . BIOTIN, BULK, MISC Use    . blood glucose diagnostic (GLUCOSE BLOOD) test strip 1 each (1 strip total) 2 (two) times daily Use as instructed. 200 each 11  . blood glucose diagnostic test strip Use 1 strip 3 (three) times daily Use as instructed.    . blood glucose meter kit as directed 1 each 0  . blood glucose meter kit Use as directed    . cholecalciferol 1000 unit tablet Take by mouth    . co-enzyme Q-10, ubiquinone, (CO Q-10) 100 mg capsule Take 100 mg by mouth once daily    . docosahexaenoic acid/vit C/lut (EYE HEALTH ORAL) Take by mouth    . lancing device with lancets kit Use 1 each 2 (two) times daily Use as instructed. 200 each 11  . losartan (COZAAR) 25 MG tablet TAKE 1 TABLET BY MOUTH DAILY 90 tablet 3  . metFORMIN  (GLUCOPHAGE ) 1000 MG tablet TAKE 1 TABLET BY MOUTH TWICE A DAY WITH MEALS 182 tablet 3  . multivitamin tablet Take 1 tablet by mouth once daily    . rosuvastatin (CRESTOR) 10 MG tablet Take 1 tablet (10 mg total) by mouth once daily 90  tablet 3  . sildenafiL (VIAGRA) 100 MG tablet Take 1/2 to 1 tablet p.o. daily as needed 10 tablet 11  . ZINC ORAL Take by mouth     No current facility-administered medications for this visit.   No Known Allergies Past Medical History:  Diagnosis Date  . DDD (degenerative disc disease), lumbar 05/12/2016   MRI 11/16 with degenerative disc disease in the lumbar spine, with moderate to severe foraminal stenosis on the right at L5-S1  . Diabetes (CMS/HHS-HCC)   . HLD (hyperlipidemia)    Past Surgical History:  Procedure Laterality Date  . COLONOSCOPY  03/15/2013   Dr. RONAL Spitz @ [facility] - Adenomatous Polyp, PH Polyps, Poor Prep and rpt immediate per [place]  . COLONOSCOPY  11/02/2017   Dr. DOROTHA Levan @ [facility] - Adenomatous Polyp, rpt 3-5 yrs per provider  . 1. Reverse left total shoulder arthroplasty. 2. Endoscopic left carpal tunnel release. Left 01/21/2020   Dr. Edie  . 1. Reverse right total shoulder arthroplasty. 2. Endoscopic right carpal tunnel release. Right 05/12/2020   Dr. Edie  . COLONOSCOPY N/A 05/04/2021   Procedure: COLONOSCOPY;  Surgeon: Dinani, Amreen, MD;  Location: [facility];  Service: Gastroenterology;  Laterality: N/A;  . ROBOT ASSISTED LAPAROSCOPIC CHOLECYSTECTOMY  11/21/2022   Dr Lucas Catchings  . APPENDECTOMY  Family History  Problem Relation Name Age of Onset  . Kidney disease Mother    . Kidney disease Father    . Diabetes Father    . High blood pressure (Hypertension) Father    . Breast cancer Sister    . Colon cancer Brother      Social History   Socioeconomic History  . Marital status: Divorced  Tobacco Use  . Smoking status: Every Day    Types: Cigars  . Smokeless tobacco: Never  . Tobacco comments:    Few cigars a day  Vaping Use  . Vaping status: Never Used  Substance and Sexual Activity  . Alcohol use: Not Currently  . Drug use: No  . Sexual activity: Defer   Social Drivers of Health   Financial Resource Strain: Low Risk   (05/31/2023)   Overall Financial Resource Strain (CARDIA)   . Difficulty of Paying Living Expenses: Not very hard  Food Insecurity: No Food Insecurity (01/12/2024)   Received from [facility]   Hunger Vital Sign   . Within the past 12 months, you worried that your food would run out before you got the money to buy more.: Never true   . Within the past 12 months, the food you bought just didn't last and you didn't have money to get more.: Never true  Transportation Needs: No Transportation Needs (01/12/2024)   Received from [facility] - Transportation   . In the past 12 months, has lack of transportation kept you from medical appointments or from getting medications?: No   . In the past 12 months, has lack of transportation kept you from meetings, work, or from getting things needed for daily living?: No    Review of Systems:  A comprehensive 14 point ROS was performed, reviewed, and the pertinent orthopaedic findings are documented in the HPI.  Exam: Vitals:   01/29/24 1005  BP: 128/78  Weight: 59.9 kg (132 lb)  Height: 172.7 cm (5' 8)  PainSc:   4  PainLoc: Hand   General/Constitutional: The patient appears to be well-nourished, well-developed, and in no acute distress. Neuro/Psych: Normal mood and affect, oriented to person, place and time. Eyes: Non-icteric.  Pupils are equal, round, and reactive to light, and exhibit synchronous movement. ENT: Unremarkable. Lymphatic: No palpable adenopathy. Respiratory: No wheezes and Non-labored breathing Cardiovascular: No edema, swelling or tenderness, except as noted in detailed exam. Integumentary: No impressive skin lesions present, except as noted in detailed exam. Musculoskeletal: Unremarkable, except as noted in detailed exam.  Right wrist/hand exam: Skin inspection of the right wrist and hand is notable for a well-healed surgical incision over the volar aspect of his wrist, but otherwise is unremarkable.  No swelling,  erythema, ecchymosis, abrasions, or other skin abnormalities are identified.  He does have mild tenderness to palpation over the palmar aspect of his hand, especially over the hook of the hamate region.  Otherwise, there are no other areas of tenderness around his wrist or hand.  He is able to actively flex and extend his wrist without any pain or catching.  He is able to actively flex and extend all digits without any pain or triggering, although he does have some difficulty making a tight fist.  He is neurovascularly intact to all digits.  He exhibits a negative Phalen's test and a negative Tinel's test.  Left wrist/hand exam:  Skin inspection of the left wrist and hand is notable for a well-healed surgical incision over the volar  aspect of his wrist, but otherwise is unremarkable.  No swelling, erythema, ecchymosis, abrasions, or other skin abnormalities are identified.  He does have mild tenderness to palpation over the palmar aspect of his hand.  Otherwise, there are no other areas of tenderness around his wrist or hand.  He is able to actively flex and extend his wrist without any pain or catching.  He is able to actively flex and extend all digits without any pain or triggering, although he does have some difficulty making a tight fist.  He is neurovascularly intact to all digits.  He exhibits a negative Phalen's test and a negative Tinel's test.  Assessment: Encounter Diagnoses  Name Primary?  . Contusion of right hand, initial encounter Yes  . Strain of right hand, initial encounter   . Strain of left hand, initial encounter     Plan: The treatment options were discussed with the patient.  In addition, patient educational materials were provided regarding the diagnosis and treatment options.  The patient is reassured that he does not appear to have sustained any more severe injury to either hand.  Given the fact that his symptoms already are improving, I feel that they will continue to  improve.  However, the patient is concerned by the fact that he still has difficulty making a full fist.  Therefore, I have recommended that he begin a course of formal occupational therapy in order to optimize his range of motion, strength, and overall function.  He may progress in his activities as symptoms permit, but is to avoid offending activities.  He may take over-the-counter medications as needed for discomfort.  All of the patient's questions and concerns were answered.  He can call any time with further concerns.  He will follow up on an as necessary basis per his request.

## 2024-02-12 NOTE — Therapy (Unsigned)
 OUTPATIENT OCCUPATIONAL THERAPY ORTHO EVALUATION  Patient Name: Jeremiah Mueller MRN: [id number] DOB:[birth year], 68 y.o., male Today's Date: 02/13/2024  PCP: Dr Fernande MART PROVIDER: Dr Edie  END OF SESSION:  OT End of Session - 02/13/24 1606     Visit Number 1    Number of Visits 6    Date for Recertification  03/26/24    OT Start Time 1211    OT Stop Time 1255    OT Time Calculation (min) 44 min    Activity Tolerance Patient tolerated treatment well    Behavior During Therapy [facility] for tasks assessed/performed          Past Medical History:  Diagnosis Date   Carpal tunnel syndrome    right   (left corrected)   DDD (degenerative disc disease), cervical    Diabetes mellitus without complication (HCC)    Dyskinesia of gallbladder    History of kidney stones    Hypercholesterolemia    Hypertension    Past Surgical History:  Procedure Laterality Date   APPENDECTOMY     CARPAL TUNNEL RELEASE Left 01/21/2020   Procedure: CARPAL TUNNEL RELEASE ENDOSCOPIC;  Surgeon: Edie Norleen PARAS, MD;  Location: [facility];  Service: Orthopedics;  Laterality: Left;   CARPAL TUNNEL RELEASE Right 05/12/2020   Procedure: CARPAL TUNNEL RELEASE ENDOSCOPIC;  Surgeon: Edie Norleen PARAS, MD;  Location: [facility];  Service: Orthopedics;  Laterality: Right;   colonscopy     ESOPHAGOGASTRODUODENOSCOPY (EGD) WITH PROPOFOL  N/A 11/02/2022   Procedure: ESOPHAGOGASTRODUODENOSCOPY (EGD) WITH PROPOFOL ;  Surgeon: Tye Millet, DO;  Location: [facility];  Service: General;  Laterality: N/A;   REVERSE SHOULDER ARTHROPLASTY Left 01/21/2020   Procedure: REVERSE SHOULDER ARTHROPLASTY - RNFA;  Surgeon: Edie Norleen PARAS, MD;  Location: [facility];  Service: Orthopedics;  Laterality: Left;   REVERSE SHOULDER ARTHROPLASTY Right 05/12/2020   Procedure: REVERSE SHOULDER ARTHROPLASTY;  Surgeon: Edie Norleen PARAS, MD;  Location: [facility];  Service: Orthopedics;  Laterality: Right;   Patient Active Problem List   Diagnosis Date Noted    CLL (chronic lymphocytic leukemia) (HCC) 01/12/2024    ONSET DATE: 01/21/24  REFERRING DIAG: Contusion and strain bilateral hands  THERAPY DIAG:  Bilateral hand pain  Bilateral wrist pain  Stiffness of right hand, not elsewhere classified  Stiffness of left hand, not elsewhere classified  Rationale for Evaluation and Treatment: Rehabilitation  SUBJECTIVE:   SUBJECTIVE STATEMENT: My hands and wrist are hurting and they feel tight since I have chopped that wood earlier this month.  I probably done it for like 6 to 8 hours that day.  That evening since then my hands hurt and my wrist hurts and my fingers hurts and they  are  tight and  are numb- I feel like pins-and-needles.  The top of the hand just feels numb.  I can pinch it.  I did had bilateral shoulder replacements and bilateral carpal tunnel surgeries in 2021 but they were doing good until before this.  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 male who presents for evaluation and treatment of bilateral hand pain associated with numbness and paresthesias which developed while chopping wood last week. After this activity, he noted increased swelling in both hands with stiffness of his fingers and reaggravation of the numbness and paresthesias he was experiencing prior to his bilateral carpal tunnel releases over 4 years ago. He  notes that he was doing well after his carpal tunnel releases until this recent flare-up of symptoms. He has been taking it easy over the past few days and does note moderate improvement in his symptoms. However, he still has difficulty making a tight fist. He does have a history of type II diabetes.   Right wrist/hand exam: Skin inspection of the right wrist and hand is notable for a well-healed surgical incision over the volar aspect of his wrist, but otherwise is unremarkable. No swelling, erythema,  ecchymosis, abrasions, or other skin abnormalities are identified. He does have mild tenderness to palpation over the palmar aspect of his hand, especially over the hook of the hamate region. Otherwise, there are no other areas of tenderness around his wrist or hand. He is able to actively flex and extend his wrist without any pain or catching. He is able to actively flex and extend all digits without any pain or triggering, although he does have some difficulty making a tight fist. He is neurovascularly intact to all digits. He exhibits a negative Phalen's test and a negative Tinel's test.  Left wrist/hand exam: Skin inspection of the left wrist and hand is notable for a well-healed surgical incision over the volar aspect of his wrist, but otherwise is unremarkable. No swelling, erythema, ecchymosis, abrasions, or other skin abnormalities are identified. He does have mild tenderness to palpation over the palmar aspect of his hand. Otherwise, there are no other areas of tenderness around his wrist or hand. He is able to actively flex and extend his wrist without any pain or catching. He is able to actively flex and extend all digits without any pain or triggering, although he does have some difficulty making a tight fist. He is neurovascularly intact to all digits. He exhibits a negative Phalen's test and a negative Tinel's test.  Assessment: Encounter Diagnoses Name Primary? Contusion of right hand, initial encounter Yes Strain of right hand, initial encounter Strain of left hand, initial encounter  Plan: The treatment options were discussed with the patient. In addition, patient educational materials were provided regarding the diagnosis and treatment options. The patient is reassured that he does not appear to have sustained any more severe injury to either hand. Given the fact that his symptoms already are improving, I feel that they will continue to improve. However, the patient is concerned by the  fact that he still has difficulty making a full fist. Therefore, I have recommended that he begin a course of formal occupational therapy in order to optimize his range of motion, strength, and overall function. He may progress in his activities as symptoms permit, but is to avoid offending activities. He may take over-the-counter medications as needed for discomfort. All of the patient's questions and concerns were answered. He can call any time with further concerns. He will follow up on an as necessary basis per his request. Electronically signed by Edie Norleen Purchase, MD at 01/29/2024 10:35 AM EDT   PRECAUTIONS: None   WEIGHT BEARING RESTRICTIONS: No  PAIN:  Are you having pain? Pain 4-8/10 pain thumb thenar eminences, ulnar side of the hands as well as the volar wrist  FALLS: Has patient fallen in last 6 months?  No  LIVING ENVIRONMENT: Lives with: lives with their spouse   PLOF: Used to drive trucks.  At the moment mostly doing things around the yard or help my wife in the house.  Read my Bible.  Watch TV.  PATIENT GOALS: I just want the  pain in my hands and wrist better and the tightness when I use it and make a fist  NEXT MD VISIT: ?  OBJECTIVE:  Note: Objective measures were completed at Evaluation unless otherwise noted.  HAND DOMINANCE: Right  ADLs: Patient report her having a hard time gripping cylinder objects with ADLs, carrying things or pick up groceries, opening doors, opening jars or package; driving is hard as well as pressing with my palm to push-up  FUNCTIONAL OUTCOME MEASURES: Next session  UPPER EXTREMITY ROM:     Active ROM Right eval Left eval  Shoulder flexion    Shoulder abduction    Shoulder adduction    Shoulder extension    Shoulder internal rotation    Shoulder external rotation    Elbow flexion    Elbow extension    Wrist flexion 80 80  Wrist extension 44 48  Wrist ulnar deviation    Wrist radial deviation    Wrist pronation     Wrist supination    (Blank rows = not tested)  Active ROM Right eval Left eval  Thumb MCP (0-60)    Thumb IP (0-80)    Thumb Radial abd/add (0-55) 55  55  Thumb Palmar abd/add (0-45) 55  55  Thumb Opposition to Small Finger Decreased opposition of fifth digit. Decreased opposition of fifth digit  Index MCP (0-90) 80 80   Index PIP (0-100) 100  100  Index DIP (0-70)      Long MCP (0-90) 90  85   Long PIP (0-100)  100 100   Long DIP (0-70)      Ring MCP (0-90)  90 90   Ring PIP (0-100)  100 100   Ring DIP (0-70)      Little MCP (0-90) 90  90   Little PIP (0-100)  100  100  Little DIP (0-70)      (Blank rows = not tested) Patient report numbness in bilateral fifth digits as well as dorsal hand.  Patient reports some issues cervical  HAND FUNCTION: Grip strength: Right: 62 lbs; Left: 66 lbs, Lateral pinch: Right: 14 lbs, Left: 14 lbs, and 3 point pinch: Right: 17 lbs, Left: 16 lbs Pain in thumb thenar eminence as well as ulnar hand with gripping. COORDINATION: Limited in buttons and zippers and opening of small packages  SENSATION: Need to be further assessed.  Patient report numbness in the pinky that is longstanding patient report numbness on the dorsal hand left worse than the right.  As well as pins-and-needles coming and going in the digits Reports some issues with neck and history of numbness  EDEMA: No edema noticed during session but patient report inflammation coming and going with increased swelling in the hands  COGNITION: Overall cognitive status: Within functional limits for tasks assessed      TREATMENT DATE: 02/13/24  Modalities: Paraffin:  Time: 8 Location:  Decreased pain and stiffness in bilateral hands and wrist.  Pain decreased afterwards as well as increased motion with less stiffnes in bilateral hands with digit flexion  extension as well as wrist.  Patient report wife he thinks has a paraffin bath.  Patient to check out but would recommend at the moment to do contrast to 3 times a day prior to Soft tissue massage over right roller over volar hands as well as forearm 20 reps each Wife can assist with metacarpal spreads as well as webspace massage 10 reps each Followed by gentle pressure stretch for composite extension of digits and wrist 10 reps hold 3 seconds Followed by tendon glides 10 reps As well as opposition to all digits 10 reps Patient to implement some joint protection and modification using larger joints like palms and forearm to pick up objects as well as in large grips getting some insulation tubing to enlarge some of his grips. Handout provided but not reviewed with patient yet will be done next session        PATIENT EDUCATION: Education details: findings of eval and HEP  Person educated: Patient Education method: Explanation, Demonstration, Tactile cues, Verbal cues, and Handouts Education comprehension: verbalized understanding, returned demonstration, verbal cues required, and needs further education     GOALS: Goals reviewed with patient? Yes       LONG TERM GOALS: Target date: 6 wks  Pain in bilateral hands and wrist decreased to less than 2/10 pain Baseline: Pain in bilateral hands and wrist with range of motion/8/10 Goal status: INITIAL  2.  Patient to be independent home program to increase wrist extension and perform digits flexion pain-free Baseline: Pain increased 4-8/10 digit flexion as well as wrist range of motion decreased wrist extension Goal status: INITIAL  3.  Patient to verbalize 3 joint protection modifications to decrease pain in bilateral hands and wrist Baseline: No knowledge.  Patient over grip he has tools and objects performing sustained grips Goal status: INITIAL  ASSESSMENT:  CLINICAL IMPRESSION: Patient seen today for occupational therapy  evaluation for bilateral hand contusion and strain after chopping some wood earlier this month.  Patient do have a history of bilateral carpal tunnel releases in 2021.  Patient arrived with increased pain and stiffness in bilateral hands, digits and wrist.  Pain 4-8/10 with gripping and flexing of digits as well as wrist.  Patient with tenderness over bilateral webspaces as well as thumb [facility].  As well as ulnar side of hand.  Patient is active range of motion in digits within normal range including thumb.  Patient do have decreased opposition of fifth as well as report numbness in the fifth digits in the dorsal hand.  Appear patient had some of that sensation issues before as well as a history of cervical involvement.  Patient report pins-and-needles in fingertips coming and going depending on what activities he does especially gripping with driving or holding a book or sustained grip.  Patient can benefit from skilled OT services to decrease pain and stiffness and increased motion while maintaining strength to be independent in ADLs and IADLs.  Patient also could benefit from education and joint protection and modification as well as adaptive equipment to improve ease independence and using of  hands in ADLs and IADLs and decrease symptoms  PERFORMANCE DEFICITS: in functional skills including ADLs, IADLs, ROM, strength, pain, flexibility, decreased knowledge of use of DME, and UE functional use,   and psychosocial skills including  environmental adaptation and routines and behaviors.   IMPAIRMENTS: are limiting patient from ADLs, IADLs, rest and sleep, play, leisure, and social participation.   COMORBIDITIES: has no other co-morbidities that affects occupational performance. Patient will benefit from skilled OT to address above impairments and improve overall function.  MODIFICATION OR ASSISTANCE TO COMPLETE EVALUATION: No modification of tasks or assist necessary to complete an evaluation.  OT  OCCUPATIONAL PROFILE AND HISTORY: Problem focused assessment: Including review of records relating to presenting problem.  CLINICAL DECISION MAKING: LOW - limited treatment options, no task modification necessary  REHAB POTENTIAL: Good for goals  EVALUATION COMPLEXITY: Low      PLAN:  OT FREQUENCY: 1-2 x week  OT DURATION: 6 weeks  PLANNED INTERVENTIONS: 97168 OT Re-evaluation, 97535 self care/ADL training, 02889 therapeutic exercise, 97530 therapeutic activity, 97112 neuromuscular re-education, 97140 manual therapy, 97018 paraffin, 02960 fluidotherapy, 97034 contrast bath, 97760 Orthotic Initial, [id number] Orthotic/Prosthetic subsequent, scar mobilization, passive range of motion, patient/family education, and DME and/or AE instructions    CONSULTED AND AGREED WITH PLAN OF CARE: Patient    Ancel Peters, OTR/L,CLT 02/13/2024, 5:13 PM

## 2024-02-13 ENCOUNTER — Encounter: Payer: Self-pay | Admitting: Occupational Therapy

## 2024-02-13 ENCOUNTER — Ambulatory Visit: Attending: Surgery | Admitting: Occupational Therapy

## 2024-02-13 DIAGNOSIS — M25532 Pain in left wrist: Secondary | ICD-10-CM | POA: Insufficient documentation

## 2024-02-13 DIAGNOSIS — M25531 Pain in right wrist: Secondary | ICD-10-CM | POA: Diagnosis present

## 2024-02-13 DIAGNOSIS — M79641 Pain in right hand: Secondary | ICD-10-CM | POA: Insufficient documentation

## 2024-02-13 DIAGNOSIS — M79642 Pain in left hand: Secondary | ICD-10-CM | POA: Insufficient documentation

## 2024-02-13 DIAGNOSIS — M25641 Stiffness of right hand, not elsewhere classified: Secondary | ICD-10-CM | POA: Insufficient documentation

## 2024-02-13 DIAGNOSIS — M25642 Stiffness of left hand, not elsewhere classified: Secondary | ICD-10-CM | POA: Insufficient documentation

## 2024-02-15 ENCOUNTER — Ambulatory Visit: Attending: Surgery | Admitting: Occupational Therapy

## 2024-02-15 ENCOUNTER — Encounter: Payer: Self-pay | Admitting: Occupational Therapy

## 2024-02-15 ENCOUNTER — Ambulatory Visit: Admitting: Occupational Therapy

## 2024-02-15 DIAGNOSIS — M25531 Pain in right wrist: Secondary | ICD-10-CM | POA: Insufficient documentation

## 2024-02-15 DIAGNOSIS — M79642 Pain in left hand: Secondary | ICD-10-CM | POA: Insufficient documentation

## 2024-02-15 DIAGNOSIS — M25641 Stiffness of right hand, not elsewhere classified: Secondary | ICD-10-CM | POA: Insufficient documentation

## 2024-02-15 DIAGNOSIS — M25532 Pain in left wrist: Secondary | ICD-10-CM | POA: Diagnosis present

## 2024-02-15 DIAGNOSIS — M25642 Stiffness of left hand, not elsewhere classified: Secondary | ICD-10-CM | POA: Insufficient documentation

## 2024-02-15 DIAGNOSIS — M79641 Pain in right hand: Secondary | ICD-10-CM | POA: Diagnosis present

## 2024-02-15 NOTE — Therapy (Signed)
 OUTPATIENT OCCUPATIONAL THERAPY ORTHO TREATMENT  Patient Name: Jeremiah Mueller MRN: [id number] DOB:September 10, 1955, 68 y.o., male Today's Date: 02/20/2024  PCP: Dr Fernande MART PROVIDER: Dr Edie  END OF SESSION:  OT End of Session - 02/20/24 1544     Visit Number 2    Number of Visits 6    Date for Recertification  03/26/24    OT Start Time 1031    OT Stop Time 1115    OT Time Calculation (min) 44 min    Activity Tolerance Patient tolerated treatment well    Behavior During Therapy WFL for tasks assessed/performed          Past Medical History:  Diagnosis Date   Carpal tunnel syndrome    right   (left corrected)   DDD (degenerative disc disease), cervical    Diabetes mellitus without complication (HCC)    Dyskinesia of gallbladder    History of kidney stones    Hypercholesterolemia    Hypertension    Past Surgical History:  Procedure Laterality Date   APPENDECTOMY     CARPAL TUNNEL RELEASE Left 01/21/2020   Procedure: CARPAL TUNNEL RELEASE ENDOSCOPIC;  Surgeon: Edie Norleen PARAS, MD;  Location: [facility];  Service: Orthopedics;  Laterality: Left;   CARPAL TUNNEL RELEASE Right 05/12/2020   Procedure: CARPAL TUNNEL RELEASE ENDOSCOPIC;  Surgeon: Edie Norleen PARAS, MD;  Location: [facility];  Service: Orthopedics;  Laterality: Right;   colonscopy     ESOPHAGOGASTRODUODENOSCOPY (EGD) WITH PROPOFOL  N/A 11/02/2022   Procedure: ESOPHAGOGASTRODUODENOSCOPY (EGD) WITH PROPOFOL ;  Surgeon: Tye Millet, DO;  Location: [facility];  Service: General;  Laterality: N/A;   REVERSE SHOULDER ARTHROPLASTY Left 01/21/2020   Procedure: REVERSE SHOULDER ARTHROPLASTY - RNFA;  Surgeon: Edie Norleen PARAS, MD;  Location: [facility];  Service: Orthopedics;  Laterality: Left;   REVERSE SHOULDER ARTHROPLASTY Right 05/12/2020   Procedure: REVERSE SHOULDER ARTHROPLASTY;  Surgeon: Edie Norleen PARAS, MD;  Location: [facility];  Service: Orthopedics;  Laterality: Right;   Patient Active Problem List   Diagnosis Date Noted    CLL (chronic lymphocytic leukemia) (HCC) 01/12/2024    ONSET DATE: 01/21/24  REFERRING DIAG: Contusion and strain bilateral hands  THERAPY DIAG:  Bilateral hand pain  Bilateral wrist pain  Stiffness of right hand, not elsewhere classified  Stiffness of left hand, not elsewhere classified  Rationale for Evaluation and Treatment: Rehabilitation  SUBJECTIVE:   SUBJECTIVE STATEMENT:  Pt reports he is doing well, has been working on exercises at home Pt accompanied by: self  PERTINENT HISTORY:  01/29/24 Ortho visit  Jeremiah Mueller is a 68 y.o. male who presents for evaluation and treatment of bilateral hand pain associated with numbness and paresthesias which developed while chopping wood last week. After this activity, he noted increased swelling in both hands with stiffness of his fingers and reaggravation of the numbness and paresthesias he was experiencing prior to his bilateral carpal tunnel releases over 4 years ago. He notes that he was doing well after his carpal tunnel releases until this recent flare-up of symptoms. He has been taking it easy over the past few days and does note moderate improvement in his symptoms. However, he still has difficulty making a tight fist. He does have a history of type II diabetes.   Right wrist/hand exam: Skin inspection of the right wrist and hand is notable for a well-healed surgical incision over the volar aspect of his wrist, but otherwise is unremarkable. No swelling, erythema, ecchymosis, abrasions, or other skin abnormalities are identified.  He does have mild tenderness to palpation over the palmar aspect of his hand, especially over the hook of the hamate region. Otherwise, there are no other areas of tenderness around his wrist or hand. He is able to actively flex and extend his wrist without any pain or catching. He is able to actively flex and extend all digits without any pain or triggering, although he does have some difficulty making a  tight fist. He is neurovascularly intact to all digits. He exhibits a negative Phalen's test and a negative Tinel's test.  Left wrist/hand exam: Skin inspection of the left wrist and hand is notable for a well-healed surgical incision over the volar aspect of his wrist, but otherwise is unremarkable. No swelling, erythema, ecchymosis, abrasions, or other skin abnormalities are identified. He does have mild tenderness to palpation over the palmar aspect of his hand. Otherwise, there are no other areas of tenderness around his wrist or hand. He is able to actively flex and extend his wrist without any pain or catching. He is able to actively flex and extend all digits without any pain or triggering, although he does have some difficulty making a tight fist. He is neurovascularly intact to all digits. He exhibits a negative Phalen's test and a negative Tinel's test.  Assessment: Encounter Diagnoses Name Primary? Contusion of right hand, initial encounter Yes Strain of right hand, initial encounter Strain of left hand, initial encounter  Plan: The treatment options were discussed with the patient. In addition, patient educational materials were provided regarding the diagnosis and treatment options. The patient is reassured that he does not appear to have sustained any more severe injury to either hand. Given the fact that his symptoms already are improving, I feel that they will continue to improve. However, the patient is concerned by the fact that he still has difficulty making a full fist. Therefore, I have recommended that he begin a course of formal occupational therapy in order to optimize his range of motion, strength, and overall function. He may progress in his activities as symptoms permit, but is to avoid offending activities. He may take over-the-counter medications as needed for discomfort. All of the patient's questions and concerns were answered. He can call any time with further concerns.  He will follow up on an as necessary basis per his request. Electronically signed by Edie Norleen Purchase, MD at 01/29/2024 10:35 AM EDT   PRECAUTIONS: None   WEIGHT BEARING RESTRICTIONS: No  PAIN:  Are you having pain? Pain 4-8/10 pain thumb thenar eminences, ulnar side of the hands as well as the volar wrist  FALLS: Has patient fallen in last 6 months?  No  LIVING ENVIRONMENT: Lives with: lives with their spouse   PLOF: Used to drive trucks.  At the moment mostly doing things around the yard or help my wife in the house.  Read my Bible.  Watch TV.  PATIENT GOALS: I just want the pain in my hands and wrist better and the tightness when I use it and make a fist  NEXT MD VISIT: ?  OBJECTIVE:  Note: Objective measures were completed at Evaluation unless otherwise noted.  HAND DOMINANCE: Right  ADLs: Patient report her having a hard time gripping cylinder objects with ADLs, carrying things or pick up groceries, opening doors, opening jars or package; driving is hard as well as pressing with my palm to push-up  FUNCTIONAL OUTCOME MEASURES: Next session  UPPER EXTREMITY ROM:     Active ROM Right eval  Left eval  Shoulder flexion    Shoulder abduction    Shoulder adduction    Shoulder extension    Shoulder internal rotation    Shoulder external rotation    Elbow flexion    Elbow extension    Wrist flexion 80 80  Wrist extension 44 48  Wrist ulnar deviation    Wrist radial deviation    Wrist pronation    Wrist supination    (Blank rows = not tested)  Active ROM Right eval Left eval  Thumb MCP (0-60)    Thumb IP (0-80)    Thumb Radial abd/add (0-55) 55  55  Thumb Palmar abd/add (0-45) 55  55  Thumb Opposition to Small Finger Decreased opposition of fifth digit. Decreased opposition of fifth digit  Index MCP (0-90) 80 80   Index PIP (0-100) 100  100  Index DIP (0-70)      Long MCP (0-90) 90  85   Long PIP (0-100)  100 100   Long DIP (0-70)      Ring MCP  (0-90)  90 90   Ring PIP (0-100)  100 100   Ring DIP (0-70)      Little MCP (0-90) 90  90   Little PIP (0-100)  100  100  Little DIP (0-70)      (Blank rows = not tested) Patient report numbness in bilateral fifth digits as well as dorsal hand.  Patient reports some issues cervical  HAND FUNCTION: Grip strength: Right: 62 lbs; Left: 66 lbs, Lateral pinch: Right: 14 lbs, Left: 14 lbs, and 3 point pinch: Right: 17 lbs, Left: 16 lbs Pain in thumb thenar eminence as well as ulnar hand with gripping. COORDINATION: Limited in buttons and zippers and opening of small packages  SENSATION: Need to be further assessed.  Patient report numbness in the pinky that is longstanding patient report numbness on the dorsal hand left worse than the right.  As well as pins-and-needles coming and going in the digits Reports some issues with neck and history of numbness  EDEMA: No edema noticed during session but patient report inflammation coming and going with increased swelling in the hands  COGNITION: Overall cognitive status: Within functional limits for tasks assessed   TREATMENT DATE: 02/15/24                                                                                                                            Modalities: Paraffin:  Time: 8 Location:  To pain and stiffness in bilateral hands and wrist.  Pain diminished afterwards as well as increased motion with less stiffnes in bilateral hands with digit flexion extension and wrist motion.  Wife has a paraffin bath but does not have any paraffin.  He plans to go get paraffin to use at home, therapist provided with recommendations for local purchase as well as internet.   Pt reports he did contrast at home since last session, 3 times a day.  Able to perform full fisting , pain in left 4/10 with aching, right 2/10 pain numbness and tingling   Manual Therapy: Following paraffin, pt was seen for manual therapy with soft tissue massage with  carpal and metacarpal spreads, webspace massage 10 reps each Red foam roller over volar forearm, 10 reps  Therapeutic Exercises:  Followed by gentle pressure stretch for composite extension of digits and wrist 10 reps hold 3 seconds tendon glides with therapist demo and tactile cues, 10 reps Opposition of thumb to all digits 10 reps  Continued education regarding joint protection and modification using larger joints like palms and forearm to pick up objects as well as in large grips getting some insulation tubing to enlarge some of his grips.  Discussed task specific demands and modifications.      PATIENT EDUCATION: Education details: findings of eval and HEP  Person educated: Patient Education method: Explanation, Demonstration, Tactile cues, Verbal cues, and Handouts Education comprehension: verbalized understanding, returned demonstration, verbal cues required, and needs further education   GOALS: Goals reviewed with patient? Yes  LONG TERM GOALS: Target date: 6 wks  Pain in bilateral hands and wrist decreased to less than 2/10 pain Baseline: Pain in bilateral hands and wrist with range of motion/8/10 Goal status: INITIAL  2.  Patient to be independent home program to increase wrist extension and perform digits flexion pain-free Baseline: Pain increased 4-8/10 digit flexion as well as wrist range of motion decreased wrist extension Goal status: INITIAL  3.  Patient to verbalize 3 joint protection modifications to decrease pain in bilateral hands and wrist Baseline: No knowledge.  Patient over grip he has tools and objects performing sustained grips Goal status: INITIAL  ASSESSMENT:  CLINICAL IMPRESSION: Patient seen for occupational therapy evaluation for bilateral hand contusion and strain after chopping some wood earlier this month.  Patient do have a history of bilateral carpal tunnel releases in 2021.  Patient arrived with increased pain and stiffness in bilateral hands,  digits and wrist.  Pain 2-4/10 with gripping and flexing of digits as well as wrist.  Patient with tenderness over bilateral webspaces as well as thumb [facility].  As well as ulnar side of hand.  Patient is active range of motion in digits within normal range including thumb.  Patient do have decreased opposition of fifth as well as report numbness in the fifth digits in the dorsal hand.  Appear patient had some of that sensation issues before as well as a history of cervical involvement.  Patient report pins-and-needles in fingertips coming and going depending on what activities he does especially gripping with driving or holding a book or sustained grip. Pt reported significant reduction in pain this date, increased ROM and functional hand use.  He has a paraffin bath at home but no wax but plans to get some paraffin wax soon to use at home.  He demonstrates understanding of the need for modifications to tasks as well as increasing surface grip and protecting joints.  Will continue to modify tasks and provide recommendations for task specific joint protection and modifications.   Patient can benefit from skilled OT services to decrease pain and stiffness and increased motion while maintaining strength to be independent in ADLs and IADLs.  Patient also could benefit from education and joint protection and modification as well as adaptive equipment to improve ease independence and using of  hands in ADLs and IADLs and decrease symptoms  PERFORMANCE DEFICITS: in functional skills including ADLs, IADLs, ROM, strength, pain,  flexibility, decreased knowledge of use of DME, and UE functional use,   and psychosocial skills including environmental adaptation and routines and behaviors.   IMPAIRMENTS: are limiting patient from ADLs, IADLs, rest and sleep, play, leisure, and social participation.   COMORBIDITIES: has no other co-morbidities that affects occupational performance. Patient will benefit from skilled OT to  address above impairments and improve overall function.  MODIFICATION OR ASSISTANCE TO COMPLETE EVALUATION: No modification of tasks or assist necessary to complete an evaluation.  OT OCCUPATIONAL PROFILE AND HISTORY: Problem focused assessment: Including review of records relating to presenting problem.  CLINICAL DECISION MAKING: LOW - limited treatment options, no task modification necessary  REHAB POTENTIAL: Good for goals  EVALUATION COMPLEXITY: Low  PLAN:  OT FREQUENCY: 1-2 x week  OT DURATION: 6 weeks  PLANNED INTERVENTIONS: 97168 OT Re-evaluation, 97535 self care/ADL training, 02889 therapeutic exercise, 97530 therapeutic activity, 97112 neuromuscular re-education, 97140 manual therapy, 97018 paraffin, 02960 fluidotherapy, 97034 contrast bath, 97760 Orthotic Initial, [id number] Orthotic/Prosthetic subsequent, scar mobilization, passive range of motion, patient/family education, and DME and/or AE instructions  CONSULTED AND AGREED WITH PLAN OF CARE: Patient  Shereda Graw, OTR/L,CLT 02/20/2024, 3:44 PM

## 2024-02-22 ENCOUNTER — Other Ambulatory Visit: Payer: Self-pay | Admitting: Internal Medicine

## 2024-02-22 DIAGNOSIS — R1032 Left lower quadrant pain: Secondary | ICD-10-CM

## 2024-02-23 NOTE — Therapy (Addendum)
 OUTPATIENT OCCUPATIONAL THERAPY ORTHO TREATMENT  Patient Name: Jeremiah Mueller MRN: [id number] DOB:1956-01-23, 68 y.o., male Today's Date: 02/26/2024  PCP: Dr Fernande MART PROVIDER: Dr Edie  END OF SESSION:  OT End of Session - 02/26/24 1210     Visit Number 3    Number of Visits 6    Date for Recertification  03/26/24    OT Start Time 1203    OT Stop Time 1243    OT Time Calculation (min) 40 min    Activity Tolerance Patient tolerated treatment well    Behavior During Therapy WFL for tasks assessed/performed           Past Medical History:  Diagnosis Date   Carpal tunnel syndrome    right   (left corrected)   DDD (degenerative disc disease), cervical    Diabetes mellitus without complication (HCC)    Dyskinesia of gallbladder    History of kidney stones    Hypercholesterolemia    Hypertension    Past Surgical History:  Procedure Laterality Date   APPENDECTOMY     CARPAL TUNNEL RELEASE Left 01/21/2020   Procedure: CARPAL TUNNEL RELEASE ENDOSCOPIC;  Surgeon: Edie Norleen PARAS, MD;  Location: [facility];  Service: Orthopedics;  Laterality: Left;   CARPAL TUNNEL RELEASE Right 05/12/2020   Procedure: CARPAL TUNNEL RELEASE ENDOSCOPIC;  Surgeon: Edie Norleen PARAS, MD;  Location: [facility];  Service: Orthopedics;  Laterality: Right;   colonscopy     ESOPHAGOGASTRODUODENOSCOPY (EGD) WITH PROPOFOL  N/A 11/02/2022   Procedure: ESOPHAGOGASTRODUODENOSCOPY (EGD) WITH PROPOFOL ;  Surgeon: Tye Millet, DO;  Location: [facility];  Service: General;  Laterality: N/A;   REVERSE SHOULDER ARTHROPLASTY Left 01/21/2020   Procedure: REVERSE SHOULDER ARTHROPLASTY - RNFA;  Surgeon: Edie Norleen PARAS, MD;  Location: [facility];  Service: Orthopedics;  Laterality: Left;   REVERSE SHOULDER ARTHROPLASTY Right 05/12/2020   Procedure: REVERSE SHOULDER ARTHROPLASTY;  Surgeon: Edie Norleen PARAS, MD;  Location: [facility];  Service: Orthopedics;  Laterality: Right;   Patient Active Problem List   Diagnosis Date  Noted   CLL (chronic lymphocytic leukemia) (HCC) 01/12/2024    ONSET DATE: 01/21/24  REFERRING DIAG: Contusion and strain bilateral hands  THERAPY DIAG:  Bilateral hand pain  Stiffness of right hand, not elsewhere classified  Rationale for Evaluation and Treatment: Rehabilitation  SUBJECTIVE:   SUBJECTIVE STATEMENT:  Pt reports he is dealing with kidney stones. Pt accompanied by: self  PERTINENT HISTORY:  01/29/24 Ortho visit  Jeremiah Mueller is a 68 y.o. male who presents for evaluation and treatment of bilateral hand pain associated with numbness and paresthesias which developed while chopping wood last week. After this activity, he noted increased swelling in both hands with stiffness of his fingers and reaggravation of the numbness and paresthesias he was experiencing prior to his bilateral carpal tunnel releases over 4 years ago. He notes that he was doing well after his carpal tunnel releases until this recent flare-up of symptoms. He has been taking it easy over the past few days and does note moderate improvement in his symptoms. However, he still has difficulty making a tight fist. He does have a history of type II diabetes.   Right wrist/hand exam: Skin inspection of the right wrist and hand is notable for a well-healed surgical incision over the volar aspect of his wrist, but otherwise is unremarkable. No swelling, erythema, ecchymosis, abrasions, or other skin abnormalities are identified. He does have mild tenderness to palpation over the palmar aspect of his hand, especially over  the hook of the hamate region. Otherwise, there are no other areas of tenderness around his wrist or hand. He is able to actively flex and extend his wrist without any pain or catching. He is able to actively flex and extend all digits without any pain or triggering, although he does have some difficulty making a tight fist. He is neurovascularly intact to all digits. He exhibits a negative Phalen's test  and a negative Tinel's test.  Left wrist/hand exam: Skin inspection of the left wrist and hand is notable for a well-healed surgical incision over the volar aspect of his wrist, but otherwise is unremarkable. No swelling, erythema, ecchymosis, abrasions, or other skin abnormalities are identified. He does have mild tenderness to palpation over the palmar aspect of his hand. Otherwise, there are no other areas of tenderness around his wrist or hand. He is able to actively flex and extend his wrist without any pain or catching. He is able to actively flex and extend all digits without any pain or triggering, although he does have some difficulty making a tight fist. He is neurovascularly intact to all digits. He exhibits a negative Phalen's test and a negative Tinel's test.  Assessment: Encounter Diagnoses Name Primary? Contusion of right hand, initial encounter Yes Strain of right hand, initial encounter Strain of left hand, initial encounter  Plan: The treatment options were discussed with the patient. In addition, patient educational materials were provided regarding the diagnosis and treatment options. The patient is reassured that he does not appear to have sustained any more severe injury to either hand. Given the fact that his symptoms already are improving, I feel that they will continue to improve. However, the patient is concerned by the fact that he still has difficulty making a full fist. Therefore, I have recommended that he begin a course of formal occupational therapy in order to optimize his range of motion, strength, and overall function. He may progress in his activities as symptoms permit, but is to avoid offending activities. He may take over-the-counter medications as needed for discomfort. All of the patient's questions and concerns were answered. He can call any time with further concerns. He will follow up on an as necessary basis per his request. Electronically signed by Edie Norleen Purchase, MD at 01/29/2024 10:35 AM EDT   PRECAUTIONS: None   WEIGHT BEARING RESTRICTIONS: No  PAIN:  Are you having pain? Pain 1/10 pain thumb thenar eminences, ulnar side of the hands as well as the volar wrist  FALLS: Has patient fallen in last 6 months?  No  LIVING ENVIRONMENT: Lives with: lives with their spouse   PLOF: Used to drive trucks.  At the moment mostly doing things around the yard or help my wife in the house.  Read my Bible.  Watch TV.  PATIENT GOALS: I just want the pain in my hands and wrist better and the tightness when I use it and make a fist  NEXT MD VISIT: ?  OBJECTIVE:  Note: Objective measures were completed at Evaluation unless otherwise noted.  HAND DOMINANCE: Right  ADLs: Patient report her having a hard time gripping cylinder objects with ADLs, carrying things or pick up groceries, opening doors, opening jars or package; driving is hard as well as pressing with my palm to push-up  FUNCTIONAL OUTCOME MEASURES: Next session  UPPER EXTREMITY ROM:     Active ROM Right eval Left eval  Shoulder flexion    Shoulder abduction    Shoulder adduction  Shoulder extension    Shoulder internal rotation    Shoulder external rotation    Elbow flexion    Elbow extension    Wrist flexion 80 80  Wrist extension 44 48  Wrist ulnar deviation    Wrist radial deviation    Wrist pronation    Wrist supination    (Blank rows = not tested)  Active ROM Right eval Left eval  Thumb MCP (0-60)    Thumb IP (0-80)    Thumb Radial abd/add (0-55) 55  55  Thumb Palmar abd/add (0-45) 55  55  Thumb Opposition to Small Finger Decreased opposition of fifth digit. Decreased opposition of fifth digit  Index MCP (0-90) 80 80   Index PIP (0-100) 100  100  Index DIP (0-70)      Long MCP (0-90) 90  85   Long PIP (0-100)  100 100   Long DIP (0-70)      Ring MCP (0-90)  90 90   Ring PIP (0-100)  100 100   Ring DIP (0-70)      Little MCP (0-90) 90  90    Little PIP (0-100)  100  100  Little DIP (0-70)      (Blank rows = not tested) Patient report numbness in bilateral fifth digits as well as dorsal hand.  Patient reports some issues cervical  HAND FUNCTION: Grip strength: Right: 62 lbs; Left: 66 lbs, Lateral pinch: Right: 14 lbs, Left: 14 lbs, and 3 point pinch: Right: 17 lbs, Left: 16 lbs Pain in thumb thenar eminence as well as ulnar hand with gripping. COORDINATION: Limited in buttons and zippers and opening of small packages  SENSATION: Need to be further assessed.  Patient report numbness in the pinky that is longstanding patient report numbness on the dorsal hand left worse than the right.  As well as pins-and-needles coming and going in the digits Reports some issues with neck and history of numbness  EDEMA: No edema noticed during session but patient report inflammation coming and going with increased swelling in the hands  COGNITION: Overall cognitive status: Within functional limits for tasks assessed   TREATMENT DATE: 02/26/24                                                                                                                            Modalities: Paraffin:  Time: 8 Location:  To pain and stiffness in bilateral hands and wrist.  Pain diminished afterwards as well as increased motion with less stiffnes in bilateral hands with digit flexion extension and wrist motion.  Reports using home Paraffin bath 2x/day and contrast bath in the mornings.  Able to perform full fisting , pain in B 2/10 with ROM   Manual Therapy: Following paraffin, pt was seen for manual therapy with soft tissue massage with carpal and metacarpal spreads, webspace massage 10 reps each Red foam roller over volar forearm, 10 reps  Therapeutic Exercises:  composite digit flexion and  extension 10 reps hold 3 seconds Wrist AROM over armrest in neutral and supination Prayer stretch with hands to chest for wrist extension stretching cues to  hold 3-5 sec tendon glides with min cues 10 reps Opposition of thumb to all digits 10 reps, slides down pinky to [facility] with pain increased 3/10 Educated on gentle cervical stretches as pt reports history of neck tightness with infrequent radiating numbness/pain.   Reports still needing to get insulation tubing for built up grip, especially on steering wheel for driving.     PATIENT EDUCATION: Education details: findings of eval and HEP  Person educated: Patient Education method: Explanation, Demonstration, Tactile cues, Verbal cues, and Handouts Education comprehension: verbalized understanding, returned demonstration, verbal cues required, and needs further education   GOALS: Goals reviewed with patient? Yes  LONG TERM GOALS: Target date: 6 wks  Pain in bilateral hands and wrist decreased to less than 2/10 pain Baseline: Pain in bilateral hands and wrist with range of motion/8/10 Goal status: INITIAL  2.  Patient to be independent home program to increase wrist extension and perform digits flexion pain-free Baseline: Pain increased 4-8/10 digit flexion as well as wrist range of motion decreased wrist extension Goal status: INITIAL  3.  Patient to verbalize 3 joint protection modifications to decrease pain in bilateral hands and wrist Baseline: No knowledge.  Patient over grip he has tools and objects performing sustained grips Goal status: INITIAL  ASSESSMENT:  CLINICAL IMPRESSION: Patient seen for occupational therapy evaluation for bilateral hand contusion and strain after chopping some wood earlier this month.  Patient do have a history of bilateral carpal tunnel releases in 2021.  Patient arrived with increased pain and stiffness in bilateral hands, digits and wrist.  Pain 2-4/10 with gripping and flexing of digits as well as wrist.  Patient with tenderness over bilateral webspaces as well as thumb [facility].  As well as ulnar side of hand.  Patient is active range of motion in digits  within normal range including thumb.  Patient do have decreased opposition of fifth as well as report numbness in the fifth digits in the dorsal hand.  Appear patient had some of that sensation issues before as well as a history of cervical involvement.  Patient report pins-and-needles in fingertips coming and going depending on what activities he does especially gripping with driving or holding a book or sustained grip. NOW: pt reports ongoing improvement of pain 1/10 at rest, 3-4/10 with repetitive activities. Performing home paraffin bath 2x daily and contrast bath 1x daily. Still needs to get insulation tubing for built up grips for joint protection. Will continue to modify tasks and provide recommendations for task specific joint protection and modifications.   Patient can benefit from skilled OT services to decrease pain and stiffness and increased motion while maintaining strength to be independent in ADLs and IADLs.  Patient also could benefit from education and joint protection and modification as well as adaptive equipment to improve ease independence and using of  hands in ADLs and IADLs and decrease symptoms  PERFORMANCE DEFICITS: in functional skills including ADLs, IADLs, ROM, strength, pain, flexibility, decreased knowledge of use of DME, and UE functional use,   and psychosocial skills including environmental adaptation and routines and behaviors.   IMPAIRMENTS: are limiting patient from ADLs, IADLs, rest and sleep, play, leisure, and social participation.   COMORBIDITIES: has no other co-morbidities that affects occupational performance. Patient will benefit from skilled OT to address above impairments and improve overall function.  MODIFICATION OR ASSISTANCE TO COMPLETE EVALUATION: No modification of tasks or assist necessary to complete an evaluation.  OT OCCUPATIONAL PROFILE AND HISTORY: Problem focused assessment: Including review of records relating to presenting  problem.  CLINICAL DECISION MAKING: LOW - limited treatment options, no task modification necessary  REHAB POTENTIAL: Good for goals  EVALUATION COMPLEXITY: Low  PLAN:  OT FREQUENCY: 1-2 x week  OT DURATION: 6 weeks  PLANNED INTERVENTIONS: 97168 OT Re-evaluation, 97535 self care/ADL training, 02889 therapeutic exercise, 97530 therapeutic activity, 97112 neuromuscular re-education, 97140 manual therapy, 97018 paraffin, 02960 fluidotherapy, 97034 contrast bath, 97760 Orthotic Initial, [id number] Orthotic/Prosthetic subsequent, scar mobilization, passive range of motion, patient/family education, and DME and/or AE instructions  CONSULTED AND AGREED WITH PLAN OF CARE: Patient  Jeremiah Mueller, OTR/L  02/26/2024, 12:53 PM

## 2024-02-26 ENCOUNTER — Ambulatory Visit: Admitting: Occupational Therapy

## 2024-02-26 ENCOUNTER — Encounter: Payer: Self-pay | Admitting: Occupational Therapy

## 2024-02-26 DIAGNOSIS — M79641 Pain in right hand: Secondary | ICD-10-CM

## 2024-02-26 DIAGNOSIS — M25641 Stiffness of right hand, not elsewhere classified: Secondary | ICD-10-CM

## 2024-02-28 ENCOUNTER — Ambulatory Visit
Admission: RE | Admit: 2024-02-28 | Discharge: 2024-02-28 | Disposition: A | Discharge status: Home / Self Care | Source: Ambulatory Visit | Attending: Internal Medicine | Admitting: Internal Medicine

## 2024-02-28 DIAGNOSIS — R1032 Left lower quadrant pain: Secondary | ICD-10-CM | POA: Insufficient documentation

## 2024-02-28 MED ORDER — IOHEXOL 300 MG/ML  SOLN
80.0000 mL | Freq: Once | INTRAMUSCULAR | Status: AC | PRN
  Administered 2024-02-28: 80 mL via INTRAVENOUS

## 2024-03-04 ENCOUNTER — Encounter: Payer: Self-pay | Admitting: Occupational Therapy

## 2024-03-04 ENCOUNTER — Ambulatory Visit: Admitting: Occupational Therapy

## 2024-03-04 DIAGNOSIS — M25641 Stiffness of right hand, not elsewhere classified: Secondary | ICD-10-CM

## 2024-03-04 DIAGNOSIS — M79641 Pain in right hand: Secondary | ICD-10-CM | POA: Diagnosis not present

## 2024-03-04 DIAGNOSIS — M79642 Pain in left hand: Secondary | ICD-10-CM

## 2024-03-04 NOTE — Therapy (Signed)
 OUTPATIENT OCCUPATIONAL THERAPY ORTHO TREATMENT / DISCHARGE NOTE  Patient Name: Jeremiah Mueller MRN: [id number] DOB:03-13-1956, 68 y.o., male Today's Date: 03/04/2024  PCP: Dr Fernande MART PROVIDER: Dr Edie  END OF SESSION:  OT End of Session - 03/04/24 1120     Visit Number 4    Number of Visits 6    Date for Recertification  03/26/24    OT Start Time 1120    OT Stop Time 1200    OT Time Calculation (min) 40 min    Activity Tolerance Patient tolerated treatment well    Behavior During Therapy WFL for tasks assessed/performed           Past Medical History:  Diagnosis Date   Carpal tunnel syndrome    right   (left corrected)   DDD (degenerative disc disease), cervical    Diabetes mellitus without complication (HCC)    Dyskinesia of gallbladder    History of kidney stones    Hypercholesterolemia    Hypertension    Past Surgical History:  Procedure Laterality Date   APPENDECTOMY     CARPAL TUNNEL RELEASE Left 01/21/2020   Procedure: CARPAL TUNNEL RELEASE ENDOSCOPIC;  Surgeon: Edie Norleen PARAS, MD;  Location: [facility];  Service: Orthopedics;  Laterality: Left;   CARPAL TUNNEL RELEASE Right 05/12/2020   Procedure: CARPAL TUNNEL RELEASE ENDOSCOPIC;  Surgeon: Edie Norleen PARAS, MD;  Location: [facility];  Service: Orthopedics;  Laterality: Right;   colonscopy     ESOPHAGOGASTRODUODENOSCOPY (EGD) WITH PROPOFOL  N/A 11/02/2022   Procedure: ESOPHAGOGASTRODUODENOSCOPY (EGD) WITH PROPOFOL ;  Surgeon: Tye Millet, DO;  Location: [facility];  Service: General;  Laterality: N/A;   REVERSE SHOULDER ARTHROPLASTY Left 01/21/2020   Procedure: REVERSE SHOULDER ARTHROPLASTY - RNFA;  Surgeon: Edie Norleen PARAS, MD;  Location: [facility];  Service: Orthopedics;  Laterality: Left;   REVERSE SHOULDER ARTHROPLASTY Right 05/12/2020   Procedure: REVERSE SHOULDER ARTHROPLASTY;  Surgeon: Edie Norleen PARAS, MD;  Location: [facility];  Service: Orthopedics;  Laterality: Right;   Patient Active Problem List    Diagnosis Date Noted   CLL (chronic lymphocytic leukemia) (HCC) 01/12/2024    ONSET DATE: 01/21/24  REFERRING DIAG: Contusion and strain bilateral hands  THERAPY DIAG:  Bilateral hand pain  Stiffness of right hand, not elsewhere classified  Rationale for Evaluation and Treatment: Rehabilitation  SUBJECTIVE:   SUBJECTIVE STATEMENT:  Pt reports going to a famiy reunion this weekend.  Pt accompanied by: self  PERTINENT HISTORY:  01/29/24 Ortho visit  Jeremiah Mueller is a 68 y.o. male who presents for evaluation and treatment of bilateral hand pain associated with numbness and paresthesias which developed while chopping wood last week. After this activity, he noted increased swelling in both hands with stiffness of his fingers and reaggravation of the numbness and paresthesias he was experiencing prior to his bilateral carpal tunnel releases over 4 years ago. He notes that he was doing well after his carpal tunnel releases until this recent flare-up of symptoms. He has been taking it easy over the past few days and does note moderate improvement in his symptoms. However, he still has difficulty making a tight fist. He does have a history of type II diabetes.   Right wrist/hand exam: Skin inspection of the right wrist and hand is notable for a well-healed surgical incision over the volar aspect of his wrist, but otherwise is unremarkable. No swelling, erythema, ecchymosis, abrasions, or other skin abnormalities are identified. He does have mild tenderness to palpation over the palmar aspect  of his hand, especially over the hook of the hamate region. Otherwise, there are no other areas of tenderness around his wrist or hand. He is able to actively flex and extend his wrist without any pain or catching. He is able to actively flex and extend all digits without any pain or triggering, although he does have some difficulty making a tight fist. He is neurovascularly intact to all digits. He exhibits a  negative Phalen's test and a negative Tinel's test.  Left wrist/hand exam: Skin inspection of the left wrist and hand is notable for a well-healed surgical incision over the volar aspect of his wrist, but otherwise is unremarkable. No swelling, erythema, ecchymosis, abrasions, or other skin abnormalities are identified. He does have mild tenderness to palpation over the palmar aspect of his hand. Otherwise, there are no other areas of tenderness around his wrist or hand. He is able to actively flex and extend his wrist without any pain or catching. He is able to actively flex and extend all digits without any pain or triggering, although he does have some difficulty making a tight fist. He is neurovascularly intact to all digits. He exhibits a negative Phalen's test and a negative Tinel's test.  Assessment: Encounter Diagnoses Name Primary? Contusion of right hand, initial encounter Yes Strain of right hand, initial encounter Strain of left hand, initial encounter  Plan: The treatment options were discussed with the patient. In addition, patient educational materials were provided regarding the diagnosis and treatment options. The patient is reassured that he does not appear to have sustained any more severe injury to either hand. Given the fact that his symptoms already are improving, I feel that they will continue to improve. However, the patient is concerned by the fact that he still has difficulty making a full fist. Therefore, I have recommended that he begin a course of formal occupational therapy in order to optimize his range of motion, strength, and overall function. He may progress in his activities as symptoms permit, but is to avoid offending activities. He may take over-the-counter medications as needed for discomfort. All of the patient's questions and concerns were answered. He can call any time with further concerns. He will follow up on an as necessary basis per his  request. Electronically signed by Edie Norleen Purchase, MD at 01/29/2024 10:35 AM EDT   PRECAUTIONS: None   WEIGHT BEARING RESTRICTIONS: No  PAIN:  Are you having pain? Pain 1/10 pain thumb thenar eminences, ulnar side of the hands as well as the volar wrist  FALLS: Has patient fallen in last 6 months?  No  LIVING ENVIRONMENT: Lives with: lives with their spouse   PLOF: Used to drive trucks.  At the moment mostly doing things around the yard or help my wife in the house.  Read my Bible.  Watch TV.  PATIENT GOALS: I just want the pain in my hands and wrist better and the tightness when I use it and make a fist  NEXT MD VISIT: ?  OBJECTIVE:  Note: Objective measures were completed at Evaluation unless otherwise noted.  HAND DOMINANCE: Right  ADLs: Patient report her having a hard time gripping cylinder objects with ADLs, carrying things or pick up groceries, opening doors, opening jars or package; driving is hard as well as pressing with my palm to push-up  FUNCTIONAL OUTCOME MEASURES: Next session  UPPER EXTREMITY ROM:     Active ROM Right eval R  03/04/24 Left eval L  03/04/24  Shoulder flexion  Shoulder abduction      Shoulder adduction      Shoulder extension      Shoulder internal rotation      Shoulder external rotation      Elbow flexion      Elbow extension      Wrist flexion 80 86 80 86  Wrist extension 44 45 48 50  Wrist ulnar deviation      Wrist radial deviation      Wrist pronation      Wrist supination      (Blank rows = not tested)  Active ROM Right eval R  03/04/24 Left eval L  03/04/24  Thumb MCP (0-60)      Thumb IP (0-80)      Thumb Radial abd/add (0-55) 55   55   Thumb Palmar abd/add (0-45) 55   55   Thumb Opposition to Small Finger Decreased opposition of fifth digit. To [facility] of  digit  Decreased opposition of fifth digit To [facility] of 5th digit   Index MCP (0-90) 80 90 80  85  Index PIP (0-100) 100   100   Index DIP (0-70)         Long MCP (0-90) 90   85    Long PIP (0-100)  100  100    Long DIP (0-70)        Ring MCP (0-90)  90  90    Ring PIP (0-100)  100  100    Ring DIP (0-70)        Little MCP (0-90) 90   90    Little PIP (0-100)  100   100   Little DIP (0-70)        (Blank rows = not tested) Patient report numbness in bilateral fifth digits as well as dorsal hand.  Patient reports some issues cervical  HAND FUNCTION: Grip strength: Right: 62 lbs; Left: 66 lbs, Lateral pinch: Right: 14 lbs, Left: 14 lbs, and 3 point pinch: Right: 17 lbs, Left: 16 lbs Pain in thumb thenar eminence as well as ulnar hand with gripping. 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  Patient report numbness in the pinky that is longstanding patient report numbness on the dorsal hand left worse than the right.  As well as pins-and-needles coming and going in the digits Reports some issues with neck and history of numbness  EDEMA: No edema noticed during session but patient report inflammation coming and going with increased swelling in the hands  COGNITION: Overall cognitive status: Within functional limits for tasks assessed   TREATMENT DATE: 03/04/24  Modalities: Paraffin:  Time: 8 Location:  To pain and stiffness in bilateral hands and wrist.  Pain diminished afterwards as well as increased motion with less stiffnes in bilateral hands with digit flexion extension and wrist motion.  Reports using home Paraffin bath 2x/day and contrast bath in the mornings.  Able to perform full fisting , pain in B hands 1/10 with ROM    Therapeutic Exercises:  composite digit flexion and extension  10 reps hold 3 seconds Wrist AROM over armrest in neutral and supination Prayer stretch with hands to chest for wrist extension stretching cues to hold 3-5 sec tendon glides with no cues 10 reps Opposition of thumb to all digits 10 reps, slides down pinky to [facility] with pain 1/10    PATIENT EDUCATION: Education details: findings of eval and HEP  Person educated: Patient Education method: Explanation, Demonstration, Tactile cues, Verbal cues, and Handouts Education comprehension: verbalized understanding, returned demonstration, verbal cues required, and needs further education   GOALS: Goals reviewed with patient? Yes  LONG TERM GOALS: Target date: 6 wks  Pain in bilateral hands and wrist decreased to less than 2/10 pain Baseline: Pain in bilateral hands and wrist with range of motion/8/10. 03/04/24: pain with movement B wrists/hands 1/10  Goal status: MET  2.  Patient to be independent home program to increase wrist extension and perform digits flexion pain-free Baseline: Pain increased 4-8/10 digit flexion as well as wrist range of motion decreased wrist extension. 03/04/24: wrist extension R 45, L 50, R index MCP flexion 90, L index MCP flexion 85 - reports arthritis limiting at baseline, 0/10 pain all movements.  Goal status: INITIAL  3.  Patient to verbalize 3 joint protection modifications to decrease pain in bilateral hands and wrist Baseline: No knowledge.  Patient over grip he has tools and objects performing sustained grips. 03/04/24: verbalize x3 strategies with good implementation.  Goal status: MET  ASSESSMENT:  CLINICAL IMPRESSION: Patient seen for occupational therapy evaluation for bilateral hand contusion and strain after chopping some wood earlier this month.  Patient do have a history of bilateral carpal tunnel releases in 2021.  Patient arrived with increased pain and stiffness in bilateral hands, digits and wrist.  Pain 2-4/10 with gripping and flexing of digits as  well as wrist.  Patient with tenderness over bilateral webspaces as well as thumb [facility].  As well as ulnar side of hand.  Patient is active range of motion in digits within normal range including thumb.  Patient do have decreased opposition of fifth as well as report numbness in the fifth digits in the dorsal hand.  Appear patient had some of that sensation issues before as well as a history of cervical involvement.  Patient report pins-and-needles in fingertips coming and going depending on what activities he does especially gripping with driving or holding a book or sustained grip. NOW: Pt has met all goals, ready for discharge. Pt reports ongoing improvement of pain 0/10 at rest, 1/10 with repetitive activities. Performing home paraffin bath 2x daily and contrast bath 1x daily. Improved grip strength 82# B hands and achieves full fist and digit opposition with no pain. Good understanding of task modifications and reports does not plan to return to repetitive work. Patient has demonstrated decreased pain and stiffness, will continue home paraffin bath, and now has increased motion to be independent in ADLs and IADLs. Reports symptoms have resolved, plan to follow up if symptoms re-occur but expresses good awareness of appropriate rest and stretches to  address.   PERFORMANCE DEFICITS: in functional skills including ADLs, IADLs, ROM, strength, pain, flexibility, decreased knowledge of use of DME, and UE functional use,   and psychosocial skills including environmental adaptation and routines and behaviors.   IMPAIRMENTS: are limiting patient from ADLs, IADLs, rest and sleep, play, leisure, and social participation.   COMORBIDITIES: has no other co-morbidities that affects occupational performance. Patient will benefit from skilled OT to address above impairments and improve overall function.  MODIFICATION OR ASSISTANCE TO COMPLETE EVALUATION: No modification of tasks or assist necessary to complete an  evaluation.  OT OCCUPATIONAL PROFILE AND HISTORY: Problem focused assessment: Including review of records relating to presenting problem.  CLINICAL DECISION MAKING: LOW - limited treatment options, no task modification necessary  REHAB POTENTIAL: Good for goals  EVALUATION COMPLEXITY: Low  PLAN:  OT FREQUENCY: 1-2 x week  OT DURATION: 6 weeks  PLANNED INTERVENTIONS: 97168 OT Re-evaluation, 97535 self care/ADL training, 02889 therapeutic exercise, 97530 therapeutic activity, 97112 neuromuscular re-education, 97140 manual therapy, 97018 paraffin, 02960 fluidotherapy, 97034 contrast bath, 97760 Orthotic Initial, [id number] Orthotic/Prosthetic subsequent, scar mobilization, passive range of motion, patient/family education, and DME and/or AE instructions  CONSULTED AND AGREED WITH PLAN OF CARE: Patient  Elston JINNY Slot, OTR/L  03/04/2024, 11:21 AM

## 2024-03-12 ENCOUNTER — Ambulatory Visit: Payer: Self-pay | Admitting: Occupational Therapy

## 2024-07-12 ENCOUNTER — Other Ambulatory Visit

## 2024-07-19 ENCOUNTER — Ambulatory Visit: Admitting: Oncology
